# Patient Record
Sex: Female | Born: 1935 | Race: White | Hispanic: No | Smoking: Former smoker
Health system: Southern US, Community
[De-identification: ages and names within clinical notes are randomized; demographics above are authoritative.]

## PROBLEM LIST (undated history)

## (undated) DIAGNOSIS — D696 Thrombocytopenia, unspecified: Secondary | ICD-10-CM

## (undated) DIAGNOSIS — H269 Unspecified cataract: Secondary | ICD-10-CM

## (undated) DIAGNOSIS — G4700 Insomnia, unspecified: Secondary | ICD-10-CM

## (undated) DIAGNOSIS — J309 Allergic rhinitis, unspecified: Secondary | ICD-10-CM

## (undated) DIAGNOSIS — R911 Solitary pulmonary nodule: Secondary | ICD-10-CM

## (undated) DIAGNOSIS — E119 Type 2 diabetes mellitus without complications: Secondary | ICD-10-CM

## (undated) DIAGNOSIS — A4902 Methicillin resistant Staphylococcus aureus infection, unspecified site: Secondary | ICD-10-CM

## (undated) HISTORY — DX: Unspecified cataract: H26.9

## (undated) HISTORY — DX: Type 2 diabetes mellitus without complications: E11.9

## (undated) HISTORY — PX: ELBOW SURGERY: SHX618

## (undated) HISTORY — DX: Thrombocytopenia, unspecified: D69.6

## (undated) HISTORY — DX: Allergic rhinitis, unspecified: J30.9

## (undated) HISTORY — DX: Insomnia, unspecified: G47.00

## (undated) HISTORY — PX: CATARACT EXTRACTION: SUR2

## (undated) HISTORY — PX: TUBAL LIGATION: SHX77

## (undated) HISTORY — PX: WRIST SURGERY: SHX841

---
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
## 1998-09-14 DIAGNOSIS — A4902 Methicillin resistant Staphylococcus aureus infection, unspecified site: Secondary | ICD-10-CM

## 1998-09-14 HISTORY — DX: Methicillin resistant Staphylococcus aureus infection, unspecified site: A49.02

## 2018-06-20 DIAGNOSIS — E119 Type 2 diabetes mellitus without complications: Secondary | ICD-10-CM | POA: Insufficient documentation

## 2018-06-20 DIAGNOSIS — G4700 Insomnia, unspecified: Secondary | ICD-10-CM | POA: Insufficient documentation

## 2018-06-24 ENCOUNTER — Other Ambulatory Visit: Payer: Self-pay | Admitting: Internal Medicine

## 2018-06-24 DIAGNOSIS — Z1231 Encounter for screening mammogram for malignant neoplasm of breast: Secondary | ICD-10-CM

## 2018-07-14 ENCOUNTER — Ambulatory Visit
Admission: RE | Admit: 2018-07-14 | Discharge: 2018-07-14 | Disposition: A | Discharge status: Home / Self Care | Payer: Medicare Other | Source: Ambulatory Visit | Attending: Internal Medicine | Admitting: Internal Medicine

## 2018-07-14 ENCOUNTER — Encounter (HOSPITAL_COMMUNITY): Payer: Self-pay

## 2018-07-14 DIAGNOSIS — Z1231 Encounter for screening mammogram for malignant neoplasm of breast: Secondary | ICD-10-CM | POA: Diagnosis not present

## 2018-12-20 DIAGNOSIS — D693 Immune thrombocytopenic purpura: Secondary | ICD-10-CM

## 2018-12-20 HISTORY — DX: Immune thrombocytopenic purpura: D69.3

## 2019-02-20 ENCOUNTER — Encounter: Payer: Self-pay | Admitting: *Deleted

## 2019-02-20 ENCOUNTER — Encounter: Payer: Medicare Other | Attending: Internal Medicine | Admitting: *Deleted

## 2019-02-20 ENCOUNTER — Other Ambulatory Visit: Payer: Self-pay

## 2019-02-20 VITALS — BP 114/80 | Ht 68.0 in | Wt 188.6 lb

## 2019-02-20 DIAGNOSIS — E119 Type 2 diabetes mellitus without complications: Secondary | ICD-10-CM | POA: Diagnosis present

## 2019-02-20 NOTE — Progress Notes (Signed)
Diabetes Self-Management Education  Visit Type: First/Initial  Appt. Start Time: 1325 Appt. End Time: 1510  02/20/2019  [name], identified by name and date of birth, is a 84 y.o. female with a diagnosis of Diabetes: Type 2.   ASSESSMENT  Blood pressure 114/80, height 5\' 8"  (1.727 m), weight 188 lb 9.6 oz (85.5 kg). Body mass index is 28.68 kg/m.  Diabetes Self-Management Education - 02/20/19 1559      Visit Information   Visit Type  First/Initial      Initial Visit   Diabetes Type  Type 2    Are you currently following a meal plan?  Yes    What type of meal plan do you follow?  "counting carbs"    Are you taking your medications as prescribed?  Yes    Date Diagnosed  2009      Health Coping   How would you rate your overall health?  Good      Psychosocial Assessment   Patient Belief/Attitude about Diabetes  Other (comment)   "yuck"   Self-care barriers  None    Self-management support  Doctor's office;Friends;Family    Patient Concerns  Nutrition/Meal planning;Monitoring;Glycemic Control;Weight Control    Special Needs  None    Preferred Learning Style  Auditory    Learning Readiness  Change in progress    How often do you need to have someone help you when you read instructions, pamphlets, or other written materials from your doctor or pharmacy?  1 - Never    What is the last grade level you completed in school?  high school      Pre-Education Assessment   Patient understands the diabetes disease and treatment process.  Needs Review    Patient understands incorporating nutritional management into lifestyle.  Needs Review    Patient undertands incorporating physical activity into lifestyle.  Needs Instruction    Patient understands using medications safely.  Needs Review    Patient understands monitoring blood glucose, interpreting and using results  Needs Review    Patient understands prevention, detection, and treatment of acute complications.  Needs  Review    Patient understands prevention, detection, and treatment of chronic complications.  Needs Review    Patient understands how to develop strategies to address psychosocial issues.  Needs Review    Patient understands how to develop strategies to promote health/change behavior.  Needs Review      Complications   Last HgB A1C per patient/outside source  7.4 %   12/20/2018   How often do you check your blood sugar?  1-2 times/day    Fasting Blood glucose range (mg/dL)  130-179;180-200   Pt reports FBG's 170-190's mg/dL    Have you had a dilated eye exam in the past 12 months?  Yes    Have you had a dental exam in the past 12 months?  Yes    Are you checking your feet?  Yes    How many days per week are you checking your feet?  7      Dietary Intake   Breakfast  english muffin plain and sometimes with egg, sausage or bacon    Snack (morning)  0-1 snack/day - nabs, fruit (apple, blueberries, strawberries)    Lunch  soup, sandwich with few chips, [employer] salad or single hamburger    Dinner  chicken, pork - does stir fry with many vegetables (peppers, onions, broccoli, zucchini, squash), brown rice, occasional pasta, wilted lettuce    Beverage(s)  wtaer, unsweetened tea, coffe, diet soda      Exercise   Exercise Type  ADL's      Patient Education   Previous Diabetes Education  Yes (please comment)    Disease state   Explored patient's options for treatment of their diabetes    Nutrition management   Role of diet in the treatment of diabetes and the relationship between the three main macronutrients and blood glucose level;Carbohydrate counting;Food label reading, portion sizes and measuring food.;Reviewed blood glucose goals for pre and post meals and how to evaluate the patients' food intake on their blood glucose level.    Physical activity and exercise   Role of exercise on diabetes management, blood pressure control and cardiac health.    Medications  Reviewed patients medication  for diabetes, action, purpose, timing of dose and side effects.    Monitoring  Purpose and frequency of SMBG.;Taught/discussed recording of test results and interpretation of SMBG.;Identified appropriate SMBG and/or A1C goals.    Chronic complications  Relationship between chronic complications and blood glucose control    Psychosocial adjustment  Identified and addressed patients feelings and concerns about diabetes      Individualized Goals (developed by patient)   Reducing Risk  Improve blood sugars Prevent diabetes complications Lose weight     Outcomes   Expected Outcomes  Demonstrated interest in learning. Expect positive outcomes    Future DMSE  2 wks       Individualized Plan for Diabetes Self-Management Training:   Learning Objective:  Patient will have a greater understanding of diabetes self-management. Patient education plan is to attend individual and/or group sessions per assessed needs and concerns.   Plan:   Patient Instructions  Check blood sugars 1 x day before breakfast every day (occasionally check 2 hrs after supper) Bring blood sugar records to the next appointment Exercise: Walk as tolerated Eat 3 meals day, 1-2 snacks a day Space meals 4-6 hours apart Complete 3 Day Food Record and bring to next appt Return for appointment on:  Monday March 06, 2019 at 1:30 pm with Pam (dietitian)  Expected Outcomes:  Demonstrated interest in learning. Expect positive outcomes  Education material provided:  General Meal Planning Guidelines Simple Meal Plan 3 Day Food Record  If problems or questions, patient to contact team via:   Johny Drilling, RN, CCM, CDE [phone]  Future DSME appointment: 2 wks  March 06, 2019 with the dieititan

## 2019-02-20 NOTE — Patient Instructions (Addendum)
Check blood sugars 1 x day before breakfast every day (occasionally check 2 hrs after supper) Bring blood sugar records to the next appointment  Exercise: Walk as tolerated  Eat 3 meals day, 1-2 snacks a day Space meals 4-6 hours apart  Complete 3 Day Food Record and bring to next appt  Return for appointment on:  Monday March 06, 2019 at 1:30 pm with [facility] (dietitian)

## 2019-03-06 ENCOUNTER — Encounter: Payer: Medicare Other | Admitting: Dietician

## 2019-03-06 ENCOUNTER — Other Ambulatory Visit: Payer: Self-pay

## 2019-03-06 VITALS — BP 118/74 | Ht 68.0 in | Wt 189.3 lb

## 2019-03-06 DIAGNOSIS — E119 Type 2 diabetes mellitus without complications: Secondary | ICD-10-CM

## 2019-03-06 NOTE — Progress Notes (Signed)
Diabetes Self-Management Education  Visit Type:  Follow-up  Appt. Start Time: 1330 Appt. End Time: 1430  03/06/2019  Raven Lewis, identified by name and date of birth, is a 84 y.o. female with a diagnosis of Diabetes:  .   ASSESSMENT  Blood pressure 118/74, height 5\' 8"  (1.727 m), weight 189 lb 4.8 oz (85.9 kg). Body mass index is 28.78 kg/m.   Diabetes Self-Management Education - 45/40/98 1191      Complications   How often do you check your blood sugar?  1-2 times/day    Fasting Blood glucose range (mg/dL)  130-179    Have you had a dilated eye exam in the past 12 months?  Yes    Have you had a dental exam in the past 12 months?  Yes    Are you checking your feet?  Yes    How many days per week are you checking your feet?  7      Dietary Intake   Breakfast  3 meals and 1 snack daily      Exercise   Exercise Type  Light (walking / raking leaves)   stays active during the day   How many days per week to you exercise?  7    How many minutes per day do you exercise?  10    Total minutes per week of exercise  70      Patient Education   Nutrition management   Role of diet in the treatment of diabetes and the relationship between the three main macronutrients and blood glucose level;Information on hints to eating out and maintain blood glucose control.;Meal options for control of blood glucose level and chronic complications.;Other (comment)   consistent carb intake and healthy carb choices   Monitoring  Taught/discussed recording of test results and interpretation of SMBG.    Psychosocial adjustment  Role of stress on diabetes      Post-Education Assessment   Patient understands the diabetes disease and treatment process.  Demonstrates understanding / competency    Patient understands incorporating nutritional management into lifestyle.  Demonstrates understanding / competency    Patient undertands incorporating physical activity into lifestyle.  Demonstrates  understanding / competency    Patient understands using medications safely.  Demonstrates understanding / competency    Patient understands monitoring blood glucose, interpreting and using results  Demonstrates understanding / competency    Patient understands prevention, detection, and treatment of acute complications.  Needs Review    Patient understands prevention, detection, and treatment of chronic complications.  Needs Review    Patient understands how to develop strategies to address psychosocial issues.  Demonstrates understanding / competency    Patient understands how to develop strategies to promote health/change behavior.  Demonstrates understanding / competency      Outcomes   Program Status  Completed       Learning Objective:  Patient will have a greater understanding of diabetes self-management.  Patient has been making diet changes to control carbohydrate intake, and is counting her daily carb grams. Advised consistent carb intake throughout the day.    Plan:   Aim for consistent carb intake, 30-45grams with each meal, and goal of 160-170grams daily.    Expected Outcomes:  Demonstrated interest in learning. Expect positive outcomes  Education material provided:   Quick and Easy Meal Ideas  Smart Snacking  If problems or questions, patient to contact team via:  Phone

## 2019-06-05 ENCOUNTER — Other Ambulatory Visit: Payer: Self-pay | Admitting: Internal Medicine

## 2019-06-05 DIAGNOSIS — Z1231 Encounter for screening mammogram for malignant neoplasm of breast: Secondary | ICD-10-CM

## 2019-07-18 ENCOUNTER — Ambulatory Visit
Admission: RE | Admit: 2019-07-18 | Discharge: 2019-07-18 | Disposition: A | Discharge status: Home / Self Care | Payer: Medicare Other | Source: Ambulatory Visit | Attending: Internal Medicine | Admitting: Internal Medicine

## 2019-07-18 DIAGNOSIS — Z1231 Encounter for screening mammogram for malignant neoplasm of breast: Secondary | ICD-10-CM

## 2020-02-06 LAB — LIPID PANEL
Cholesterol: 182 (ref 0–200)
HDL: 59 (ref 35–70)
LDL Cholesterol: 88
Triglycerides: 172 — AB (ref 40–160)

## 2020-02-06 LAB — HEMOGLOBIN A1C: Hemoglobin A1C: 6.8

## 2020-06-10 ENCOUNTER — Other Ambulatory Visit: Payer: Self-pay | Admitting: Internal Medicine

## 2020-06-17 ENCOUNTER — Telehealth: Payer: Self-pay

## 2020-06-17 NOTE — Telephone Encounter (Signed)
Pt's daughter Lesle Chris called to report that the pt is sick, she has had daily diarrhea consistently for 5 weeks. She is diabetic and takes Metformin. She adjusted her Rx with her endocrinologist and no change.  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 VM available)

## 2020-06-17 NOTE — Telephone Encounter (Signed)
Copied from [place] [id number]. Topic: Appointment Scheduling - New Patient >> Jun 14, 2020  4:45 PM Rainey Pines A wrote: Patient wants to know if Dr. B would be able to take her on as a new patient. Please advise  Best contact [phone]

## 2020-07-16 ENCOUNTER — Other Ambulatory Visit: Payer: Self-pay

## 2020-07-16 ENCOUNTER — Ambulatory Visit (INDEPENDENT_AMBULATORY_CARE_PROVIDER_SITE_OTHER): Payer: Medicare Other | Admitting: Family Medicine

## 2020-07-16 ENCOUNTER — Encounter: Payer: Self-pay | Admitting: Family Medicine

## 2020-07-16 VITALS — BP 120/69 | HR 85 | Temp 97.9°F | Resp 16 | Ht 67.0 in | Wt 173.0 lb

## 2020-07-16 DIAGNOSIS — J309 Allergic rhinitis, unspecified: Secondary | ICD-10-CM | POA: Insufficient documentation

## 2020-07-16 DIAGNOSIS — R5383 Other fatigue: Secondary | ICD-10-CM

## 2020-07-16 DIAGNOSIS — D693 Immune thrombocytopenic purpura: Secondary | ICD-10-CM | POA: Diagnosis not present

## 2020-07-16 DIAGNOSIS — J301 Allergic rhinitis due to pollen: Secondary | ICD-10-CM

## 2020-07-16 DIAGNOSIS — G4700 Insomnia, unspecified: Secondary | ICD-10-CM | POA: Diagnosis not present

## 2020-07-16 DIAGNOSIS — E1129 Type 2 diabetes mellitus with other diabetic kidney complication: Secondary | ICD-10-CM | POA: Insufficient documentation

## 2020-07-16 DIAGNOSIS — D696 Thrombocytopenia, unspecified: Secondary | ICD-10-CM | POA: Insufficient documentation

## 2020-07-16 DIAGNOSIS — K591 Functional diarrhea: Secondary | ICD-10-CM | POA: Insufficient documentation

## 2020-07-16 DIAGNOSIS — R809 Proteinuria, unspecified: Secondary | ICD-10-CM

## 2020-07-16 DIAGNOSIS — C439 Malignant melanoma of skin, unspecified: Secondary | ICD-10-CM | POA: Insufficient documentation

## 2020-07-16 DIAGNOSIS — E663 Overweight: Secondary | ICD-10-CM | POA: Insufficient documentation

## 2020-07-16 DIAGNOSIS — L989 Disorder of the skin and subcutaneous tissue, unspecified: Secondary | ICD-10-CM

## 2020-07-16 MED ORDER — SEMAGLUTIDE(0.25 OR 0.5MG/DOS) 2 MG/1.5ML ~~LOC~~ SOPN: 0.5000 mg | PEN_INJECTOR | SUBCUTANEOUS | 3 refills | Status: DC

## 2020-07-16 MED ORDER — LISINOPRIL 5 MG PO TABS
5.0000 mg | ORAL_TABLET | Freq: Every day | ORAL | 3 refills | Status: DC
Start: 2020-07-16 — End: 2020-08-20

## 2020-07-16 MED ORDER — ZOLPIDEM TARTRATE 5 MG PO TABS
5.0000 mg | ORAL_TABLET | Freq: Every evening | ORAL | 5 refills | Status: DC | PRN
Start: 2020-07-16 — End: 2020-08-20

## 2020-07-16 MED ORDER — METFORMIN HCL ER 500 MG PO TB24: 500.0000 mg | ORAL_TABLET | Freq: Two times a day (BID) | ORAL | 3 refills | Status: DC

## 2020-07-16 NOTE — Assessment & Plan Note (Signed)
Patient is not sure how long this has been present It is bluish in color and seems to have bled recently Concern for possible basal cell carcinoma Referral to dermatology for evaluation and possible biopsy

## 2020-07-16 NOTE — Assessment & Plan Note (Signed)
Longstanding issue She has taken Ambien for many years We did discuss the possible risks/harms associated with Ambien in the elderly Patient wishes to continue We will not plan to increase dose Refill given for 98-month supply of Ambien 5 mg nightly as needed today

## 2020-07-16 NOTE — Assessment & Plan Note (Signed)
Unclear etiology Longstanding and mild Recheck CBC Avoid aspirin

## 2020-07-16 NOTE — Assessment & Plan Note (Signed)
Continue low-dose lisinopril

## 2020-07-16 NOTE — Progress Notes (Signed)
New patient visit   Patient: Raven Lewis   DOB: 1935-09-04   85 y.o. Female  MRN: [id number] Visit Date: 07/16/2020  Today's healthcare provider: Lavon Paganini, MD   Chief Complaint  Patient presents with  . New Patient (Initial Visit)  . Diarrhea   Subjective    Raven Lewis is a 85 y.o. female who presents today as a new patient to establish care.  Patient reports that she has had diarrhea for the whole month of September. Did better in October.  Had her energy back.  A cousin visited yesterday.  She went out to eat last night and had Chicken Parm.  Had 2 normal BMs.  Does not eat many vegetables and has intermittent constipation.  She reports that this morning it was the worst. She had explosive diarrhea that covered the bathroom walls.  She denies fevers, nausea, vomiting, or feels any relation to foods.  No blood in the stool  Windowed 4 years ago. Moved ot this area 2 years ago.  Has had major diet changes since moving here and living alone.  Cutting back on dairy and cutting out alcohol was not helpful.  Has been taking Ambien 5mg  qhs for many years for insomnia.  Feels this controls it well.    T2DM: Metformin XR 500mg  BID, Ozempic 0.25mg  weekly.  Followed by Dr Gabriel Carina.  Does not think Metformin contributes to diarrhea.  Wants  Past Medical History:  Diagnosis Date  . Allergic rhinitis   . Cataract   . Diabetes mellitus without complication ([place])   . Insomnia   . MRSA (methicillin resistant Staphylococcus aureus) 2000   right elbow  . Thrombocytopenia ([place])    Past Surgical History:  Procedure Laterality Date  . CATARACT EXTRACTION Left   . ELBOW SURGERY     MRSA in right elbow  . TUBAL LIGATION    . WRIST SURGERY     left wrist   Family Status  Relation Name Status  . Mother  Deceased  . Father  Deceased  . Sister  Deceased  . Brother  Deceased  . Neg Hx  (Not Specified)   Family History  Problem Relation Age of Onset  . Breast cancer  Mother 19  . Heart disease Sister        heart failure  . Lung cancer Brother   . Colon cancer Neg Hx    Social History   Socioeconomic History  . Marital status: Widowed    Spouse name: Not on file  . Number of children: 2  . Years of education: Not on file  . Highest education level: Not on file  Occupational History  . Occupation: retired  Tobacco Use  . Smoking status: Former Smoker    Packs/day: 1.50    Years: 40.00    Pack years: 60.00    Types: Cigarettes    Quit date: 11/29/2000    Years since quitting: 19.6  . Smokeless tobacco: Never Used  Vaping Use  . Vaping Use: Never used  Substance and Sexual Activity  . Alcohol use: Yes    Alcohol/week: 1.0 standard drink    Types: 1 Glasses of wine per week    Comment: occasionally, less than weekly  . Drug use: Never  . Sexual activity: Not Currently  Other Topics Concern  . Not on file  Social History Narrative  . Not on file   Social Determinants of Health   Financial Resource Strain:   . Difficulty of Paying  Living Expenses: Not on file  Food Insecurity:   . Worried About Charity fundraiser in the Last Year: Not on file  . Ran Out of Food in the Last Year: Not on file  Transportation Needs:   . Lack of Transportation (Medical): Not on file  . Lack of Transportation (Non-Medical): Not on file  Physical Activity:   . Days of Exercise per Week: Not on file  . Minutes of Exercise per Session: Not on file  Stress:   . Feeling of Stress : Not on file  Social Connections:   . Frequency of Communication with Friends and Family: Not on file  . Frequency of Social Gatherings with Friends and Family: Not on file  . Attends Religious Services: Not on file  . Active Member of Clubs or Organizations: Not on file  . Attends Archivist Meetings: Not on file  . Marital Status: Not on file   Outpatient Medications Prior to Visit  Medication Sig  . [DISCONTINUED] lisinopril (ZESTRIL) 5 MG tablet Take 5 mg by  mouth daily.  . clobetasol (TEMOVATE) 0.05 % external solution   . FREESTYLE TEST STRIPS test strip TEST TID UTD  . Lancets (FREESTYLE) lancets USE TID  . Loratadine 10 MG CAPS Take 10 mg by mouth daily as needed.  Marland Kitchen olopatadine (PATANOL) 0.1 % ophthalmic solution INSTILL 1 DROP INTO BOTH EYES TWICE A DAY  . [DISCONTINUED] meclizine (ANTIVERT) 25 MG tablet   . [DISCONTINUED] metFORMIN (GLUCOPHAGE-XR) 500 MG 24 hr tablet Take 1 tablet by mouth daily.  . [DISCONTINUED] ondansetron (ZOFRAN) 4 MG tablet   . [DISCONTINUED] Semaglutide,0.25 or 0.5MG /DOS, 2 MG/1.5ML SOPN Inject 0.25 mg into the skin once a week.  . [DISCONTINUED] zolpidem (AMBIEN) 5 MG tablet Take 5 mg by mouth at bedtime as needed.   No facility-administered medications prior to visit.   No Known Allergies  Immunization History  Administered Date(s) Administered  . PFIZER SARS-COV-2 Vaccination 09/25/2019, 10/16/2019    Health Maintenance  Topic Date Due  . HEMOGLOBIN A1C  Never done  . OPHTHALMOLOGY EXAM  Never done  . TETANUS/TDAP  Never done  . DEXA SCAN  Never done  . PNA vac Low Risk Adult (1 of 2 - PCV13) Never done  . FOOT EXAM  07/16/2021  . INFLUENZA VACCINE  Completed  . COVID-19 Vaccine  Completed    Patient Care Team: Virginia Crews, MD as PCP - General (Family Medicine)  Review of Systems  Constitutional: Negative.   HENT: Negative.   Eyes: Negative.   Respiratory: Negative.   Cardiovascular: Negative.   Gastrointestinal: Positive for diarrhea.  Endocrine: Negative.   Genitourinary: Negative.   Musculoskeletal: Negative.   Skin: Negative.   Allergic/Immunologic: Negative.   Neurological: Negative.   Hematological: Negative.   Psychiatric/Behavioral: Negative.       Objective    BP 120/69   Pulse 85   Temp 97.9 F (36.6 C)   Resp 16   Ht 5\' 7"  (1.702 m)   Wt 173 lb (78.5 kg)   BMI 27.10 kg/m  Physical Exam Vitals reviewed.  Constitutional:      General: She is not in  acute distress.    Appearance: Normal appearance. She is well-developed. She is not diaphoretic.  HENT:     Head: Normocephalic and atraumatic.  Eyes:     General: No scleral icterus.    Conjunctiva/sclera: Conjunctivae normal.  Neck:     Thyroid: No thyromegaly.  Cardiovascular:  Rate and Rhythm: Normal rate and regular rhythm.     Pulses: Normal pulses.     Heart sounds: Normal heart sounds. No murmur heard.   Pulmonary:     Effort: Pulmonary effort is normal. No respiratory distress.     Breath sounds: Normal breath sounds. No wheezing, rhonchi or rales.  Musculoskeletal:     Cervical back: Neck supple.     Right lower leg: No edema.     Left lower leg: No edema.  Lymphadenopathy:     Cervical: No cervical adenopathy.  Skin:    General: Skin is warm and dry.     Comments: Lesion on frontal scalp underneath hair that is about 1 cm in diameter and bluish in color  Neurological:     Mental Status: She is alert and oriented to person, place, and time. Mental status is at baseline.  Psychiatric:        Mood and Affect: Mood normal.        Behavior: Behavior normal.     Depression Screen PHQ 2/9 Scores 07/16/2020 02/20/2019  PHQ - 2 Score 0 0   No results found for any visits on 07/16/20.  Assessment & Plan      Problem List Items Addressed This Visit      Respiratory   Allergic rhinitis    Chronic and fairly well controlled Continue OTC Claritin        Digestive   Functional diarrhea    Was ongoing for about a month Did have some improvement throughout the month of October after switching her doses of Metformin to be before bedtime Has now recurred No signs of infectious diarrhea given time course and lack of infectious symptoms Check TSH, CMP, CBC Advised on dietary changes, including adding fiber to her diet Patient will keep a log of food intake when this occurs so that we can find any pattern If not improving, we will need to try holding her metformin to  see if it improves and possibly switching to another antidiabetic medication      Relevant Orders   Basic Metabolic Panel (BMET)   TSH     Endocrine   T2DM (type 2 diabetes mellitus) ([place]) - Primary    Previously well controlled with last A1c 6.8 She has been followed by endocrinology, but this prefers PCP to follow diabetes as well as her other chronic conditions if possible She is not currently on a statin She is on an ACE inhibitor Foot exam completed today Referral to ophthalmology for next eye exam ROI sent for last eye exam ROI sent for vaccination records Continue current medications, but consider stopping Metformin if diarrhea does not improve as above Recheck A1c      Relevant Medications   metFORMIN (GLUCOPHAGE-XR) 500 MG 24 hr tablet   Semaglutide,0.25 or 0.5MG /DOS, 2 MG/1.5ML SOPN   lisinopril (ZESTRIL) 5 MG tablet   Other Relevant Orders   Ambulatory referral to Ophthalmology   Basic Metabolic Panel (BMET)   Hemoglobin A1c   Microalbuminuria due to type 2 diabetes mellitus (HCC)    Continue low-dose lisinopril      Relevant Medications   metFORMIN (GLUCOPHAGE-XR) 500 MG 24 hr tablet   Semaglutide,0.25 or 0.5MG /DOS, 2 MG/1.5ML SOPN   lisinopril (ZESTRIL) 5 MG tablet     Musculoskeletal and Integument   Scalp lesion    Patient is not sure how long this has been present It is bluish in color and seems to have bled recently Concern for  possible basal cell carcinoma Referral to dermatology for evaluation and possible biopsy      Relevant Orders   Ambulatory referral to Dermatology     Hematopoietic and Hemostatic   Thrombocytopenia, idiopathic (HCC)    Unclear etiology Longstanding and mild Recheck CBC Avoid aspirin      Relevant Orders   CBC w/Diff/Platelet     Other   Insomnia    Longstanding issue She has taken Ambien for many years We did discuss the possible risks/harms associated with Ambien in the elderly Patient wishes to continue We  will not plan to increase dose Refill given for 74-month supply of Ambien 5 mg nightly as needed today      Overweight    Discussed importance of healthy weight management Discussed diet and exercise       Relevant Orders   Basic Metabolic Panel (BMET)   Hemoglobin A1c   CBC w/Diff/Platelet   TSH    Other Visit Diagnoses    Other fatigue        Relevant Orders   TSH       Reviewed patient's records in [facility] everywhere ROI sent to [facility] for immunization record We will abstract last labs from April and May 2021 ROI sent to [facility] for last eye exam  Return in about 3 months (around 10/16/2020) for chronic disease f/u.      Total time spent on today's visit was greater than 60 minutes, including both face-to-face time and nonface-to-face time personally spent on review of chart (labs and imaging), discussing labs and goals, discussing further work-up, treatment options, referrals to specialist if needed, reviewing outside records of pertinent, answering patient's questions, and coordinating care.    I, Lavon Paganini, MD, have reviewed all documentation for this visit. The documentation on 07/16/20 for the exam, diagnosis, procedures, and orders are all accurate and complete.   Terik Haughey, Dionne Bucy, MD, MPH [facility]

## 2020-07-16 NOTE — Assessment & Plan Note (Signed)
Was ongoing for about a month Did have some improvement throughout the month of October after switching her doses of Metformin to be before bedtime Has now recurred No signs of infectious diarrhea given time course and lack of infectious symptoms Check TSH, CMP, CBC Advised on dietary changes, including adding fiber to her diet Patient will keep a log of food intake when this occurs so that we can find any pattern If not improving, we will need to try holding her metformin to see if it improves and possibly switching to another antidiabetic medication

## 2020-07-16 NOTE — Assessment & Plan Note (Addendum)
Previously well controlled with last A1c 6.8 She has been followed by endocrinology, but this prefers PCP to follow diabetes as well as her other chronic conditions if possible She is not currently on a statin She is on an ACE inhibitor Foot exam completed today Referral to ophthalmology for next eye exam ROI sent for last eye exam ROI sent for vaccination records Continue current medications, but consider stopping Metformin if diarrhea does not improve as above Recheck A1c

## 2020-07-16 NOTE — Assessment & Plan Note (Signed)
Discussed importance of healthy weight management Discussed diet and exercise  

## 2020-07-16 NOTE — Assessment & Plan Note (Signed)
Chronic and fairly well controlled Continue OTC Claritin

## 2020-07-17 ENCOUNTER — Other Ambulatory Visit: Payer: Self-pay | Admitting: Physician Assistant

## 2020-07-17 ENCOUNTER — Other Ambulatory Visit: Payer: Self-pay | Admitting: Family Medicine

## 2020-07-17 DIAGNOSIS — Z1231 Encounter for screening mammogram for malignant neoplasm of breast: Secondary | ICD-10-CM

## 2020-07-17 LAB — BASIC METABOLIC PANEL
BUN/Creatinine Ratio: 21 (ref 12–28)
BUN: 15 mg/dL (ref 8–27)
CO2: 22 mmol/L (ref 20–29)
Calcium: 9.8 mg/dL (ref 8.7–10.3)
Chloride: 102 mmol/L (ref 96–106)
Creatinine, Ser: 0.71 mg/dL (ref 0.57–1.00)
GFR calc Af Amer: 91 mL/min/{1.73_m2} (ref >59)
GFR calc non Af Amer: 79 mL/min/{1.73_m2} (ref >59)
Glucose: 135 mg/dL — ABNORMAL HIGH (ref 65–99)
Potassium: 4.3 mmol/L (ref 3.5–5.2)
Sodium: 139 mmol/L (ref 134–144)

## 2020-07-17 LAB — CBC WITH DIFFERENTIAL/PLATELET
Basophils Absolute: 0 10*3/uL (ref 0.0–0.2)
Basos: 1 %
EOS (ABSOLUTE): 0.1 10*3/uL (ref 0.0–0.4)
Eos: 2 %
Hematocrit: 40.6 % (ref 34.0–46.6)
Hemoglobin: 13.7 g/dL (ref 11.1–15.9)
Immature Grans (Abs): 0 10*3/uL (ref 0.0–0.1)
Immature Granulocytes: 0 %
Lymphocytes Absolute: 1.2 10*3/uL (ref 0.7–3.1)
Lymphs: 18 %
MCH: 33.5 pg — ABNORMAL HIGH (ref 26.6–33.0)
MCHC: 33.7 g/dL (ref 31.5–35.7)
MCV: 99 fL — ABNORMAL HIGH (ref 79–97)
Monocytes Absolute: 0.5 10*3/uL (ref 0.1–0.9)
Monocytes: 7 %
Neutrophils Absolute: 4.7 10*3/uL (ref 1.4–7.0)
Neutrophils: 72 %
Platelets: 153 10*3/uL (ref 150–450)
RBC: 4.09 x10E6/uL (ref 3.77–5.28)
RDW: 12.8 % (ref 11.7–15.4)
WBC: 6.6 10*3/uL (ref 3.4–10.8)

## 2020-07-17 LAB — HEMOGLOBIN A1C
Est. average glucose Bld gHb Est-mCnc: 143 mg/dL
Hgb A1c MFr Bld: 6.6 % — ABNORMAL HIGH (ref 4.8–5.6)

## 2020-07-17 LAB — TSH: TSH: 1.24 u[IU]/mL (ref 0.450–4.500)

## 2020-07-19 ENCOUNTER — Ambulatory Visit
Admission: RE | Admit: 2020-07-19 | Discharge: 2020-07-19 | Disposition: A | Discharge status: Home / Self Care | Payer: Medicare Other | Source: Ambulatory Visit | Attending: Family Medicine | Admitting: Family Medicine

## 2020-07-19 ENCOUNTER — Other Ambulatory Visit: Payer: Self-pay

## 2020-07-19 DIAGNOSIS — Z1231 Encounter for screening mammogram for malignant neoplasm of breast: Secondary | ICD-10-CM | POA: Diagnosis present

## 2020-07-25 ENCOUNTER — Encounter: Payer: Self-pay | Admitting: Family Medicine

## 2020-08-01 ENCOUNTER — Encounter: Payer: Self-pay | Admitting: Family Medicine

## 2020-08-01 ENCOUNTER — Ambulatory Visit (INDEPENDENT_AMBULATORY_CARE_PROVIDER_SITE_OTHER): Payer: Medicare Other | Admitting: Family Medicine

## 2020-08-01 ENCOUNTER — Other Ambulatory Visit: Payer: Self-pay

## 2020-08-01 VITALS — Temp 97.8°F

## 2020-08-01 DIAGNOSIS — J029 Acute pharyngitis, unspecified: Secondary | ICD-10-CM | POA: Diagnosis not present

## 2020-08-01 MED ORDER — PREDNISONE 5 MG PO TABS: ORAL_TABLET | ORAL | 0 refills | Status: DC

## 2020-08-01 MED ORDER — AMOXICILLIN 500 MG PO CAPS: 500.0000 mg | ORAL_CAPSULE | Freq: Three times a day (TID) | ORAL | 0 refills | Status: DC

## 2020-08-01 NOTE — Progress Notes (Signed)
Virtual telephone visit    Virtual Visit via Telephone Note   This visit type was conducted due to national recommendations for restrictions regarding the COVID-19 Pandemic (e.g. social distancing) in an effort to limit this patient's exposure and mitigate transmission in our community. Due to her co-morbid illnesses, this patient is at least at moderate risk for complications without adequate follow up. This format is felt to be most appropriate for this patient at this time. The patient did not have access to video technology or had technical difficulties with video requiring transitioning to audio format only (telephone). Physical exam was limited to content and character of the telephone converstion.    Patient location: Home Provider location: Office  I discussed the limitations of evaluation and management by telemedicine and the availability of in person appointments. The patient expressed understanding and agreed to proceed.   Visit Date: 08/01/2020  Today's healthcare provider: Vernie Murders, PA   Chief Complaint  Patient presents with  . Sore Throat   Subjective    Sore Throat  This is a new problem. The current episode started yesterday. The problem has been gradually worsening. There has been no fever. Associated symptoms include trouble swallowing. Pertinent negatives include no congestion, coughing, ear pain or headaches. She has had no exposure to strep. She has tried gargles and acetaminophen for the symptoms. The treatment provided mild relief.        Past Medical History:  Diagnosis Date  . Allergic rhinitis   . Cataract   . Diabetes mellitus without complication ([place])   . Insomnia   . MRSA (methicillin resistant Staphylococcus aureus) 2000   right elbow  . Thrombocytopenia ([place])    Past Surgical History:  Procedure Laterality Date  . CATARACT EXTRACTION Left   . ELBOW SURGERY     MRSA in right elbow  . TUBAL LIGATION    . WRIST SURGERY     left  wrist   Social History   Tobacco Use  . Smoking status: Former Smoker    Packs/day: 1.50    Years: 40.00    Pack years: 60.00    Types: Cigarettes    Quit date: 11/29/2000    Years since quitting: 19.6  . Smokeless tobacco: Never Used  Vaping Use  . Vaping Use: Never used  Substance Use Topics  . Alcohol use: Yes    Alcohol/week: 1.0 standard drink    Types: 1 Glasses of wine per week    Comment: occasionally, less than weekly  . Drug use: Never   Family History  Problem Relation Age of Onset  . Breast cancer Mother 77  . Heart disease Sister        heart failure  . Lung cancer Brother   . Colon cancer Neg Hx    No Known Allergies    Medications: Outpatient Medications Prior to Visit  Medication Sig  . clobetasol (TEMOVATE) 0.05 % external solution   . FREESTYLE TEST STRIPS test strip TEST TID UTD  . Lancets (FREESTYLE) lancets USE TID  . lisinopril (ZESTRIL) 5 MG tablet Take 1 tablet (5 mg total) by mouth daily.  . Loratadine 10 MG CAPS Take 10 mg by mouth daily as needed.  . metFORMIN (GLUCOPHAGE-XR) 500 MG 24 hr tablet Take 1 tablet (500 mg total) by mouth in the morning and at bedtime.  Marland Kitchen olopatadine (PATANOL) 0.1 % ophthalmic solution INSTILL 1 DROP INTO BOTH EYES TWICE A DAY  . Semaglutide,0.25 or 0.5MG /DOS, 2 MG/1.5ML SOPN  Inject 0.5 mg into the skin once a week.  . zolpidem (AMBIEN) 5 MG tablet Take 1 tablet (5 mg total) by mouth at bedtime as needed. Do not fill <30 days from last refill   No facility-administered medications prior to visit.    Review of Systems  Constitutional: Positive for fatigue. Negative for fever.  HENT: Positive for sore throat, trouble swallowing and voice change. Negative for congestion, ear pain, sinus pressure and sinus pain.   Respiratory: Negative for cough.   Cardiovascular: Negative.   Neurological: Negative for headaches.      Objective    Temp 97.8 F (36.6 C) Comment: per patient  During telephonic interview,  patient talks as if she is holding a "hot potato" or "marbles" in her mouth. No apparent dyspnea or wheeze.   Assessment & Plan     1. Acute pharyngitis, unspecified etiology Had some PND with allergies the past few days but it stopped and a severe sore throat with swollen uvula (by description) developed yesterday. Slight help from saltwater gargles this morning. States temperature has not been elevated, no dyspnea, loss of taste or chills. No exposure to COVID known. Will treat with suspected strep throat with uvulitis. Given prednisone taper for swelling. Advised to go to the ER if having dyspnea or dysphagia/choking. Call or return prn. - amoxicillin (AMOXIL) 500 MG capsule; Take 1 capsule (500 mg total) by mouth 3 (three) times daily.  Dispense: 30 capsule; Refill: 0 - predniSONE (DELTASONE) 5 MG tablet; Taper down by 1 tablet daily by mouth starting at 6 day 1, then, 5 day 2, 4 day 3, 3 day 4, 2 day 5 and 1 day 6. Divide dosage among meals and bedtime each day.  Dispense: 21 tablet; Refill: 0   No follow-ups on file.    I discussed the assessment and treatment plan with the patient. The patient was provided an opportunity to ask questions and all were answered. The patient agreed with the plan and demonstrated an understanding of the instructions.   The patient was advised to call back or seek an in-person evaluation if the symptoms worsen or if the condition fails to improve as anticipated.  I provided 20 minutes of non-face-to-face time during this encounter.  Andres Shad, PA, have reviewed all documentation for this visit. The documentation on 08/01/20 for the exam, diagnosis, procedures, and orders are all accurate and complete.   Vernie Murders, [place] [phone] (phone) [phone] (fax)  [place]

## 2020-08-19 ENCOUNTER — Telehealth: Payer: Self-pay

## 2020-08-19 NOTE — Telephone Encounter (Signed)
Copied from [place] [id number]. Topic: Quick Communication - See Telephone Encounter >> Aug 19, 2020  8:15 AM Loma Boston wrote: CRM for notification. See Telephone encounter for: 08/19/20.Please call pt for a possible appt, having diarrhea with metformin but also told by Dr B she would handle standard GI issues due to history [phone] please verify with nurse and call pt back for appt at [phone]

## 2020-08-19 NOTE — Telephone Encounter (Signed)
Ok to schedule appt

## 2020-08-20 ENCOUNTER — Other Ambulatory Visit: Payer: Self-pay

## 2020-08-20 ENCOUNTER — Ambulatory Visit (INDEPENDENT_AMBULATORY_CARE_PROVIDER_SITE_OTHER): Payer: Medicare Other | Admitting: Family Medicine

## 2020-08-20 ENCOUNTER — Encounter: Payer: Self-pay | Admitting: Family Medicine

## 2020-08-20 VITALS — BP 122/72 | HR 70 | Temp 97.9°F | Resp 16 | Wt 175.0 lb

## 2020-08-20 DIAGNOSIS — G4700 Insomnia, unspecified: Secondary | ICD-10-CM | POA: Diagnosis not present

## 2020-08-20 DIAGNOSIS — E1129 Type 2 diabetes mellitus with other diabetic kidney complication: Secondary | ICD-10-CM

## 2020-08-20 DIAGNOSIS — K591 Functional diarrhea: Secondary | ICD-10-CM

## 2020-08-20 DIAGNOSIS — R002 Palpitations: Secondary | ICD-10-CM | POA: Insufficient documentation

## 2020-08-20 DIAGNOSIS — R809 Proteinuria, unspecified: Secondary | ICD-10-CM

## 2020-08-20 MED ORDER — LISINOPRIL 5 MG PO TABS
5.0000 mg | ORAL_TABLET | Freq: Every day | ORAL | 3 refills | Status: DC
Start: 2020-08-20 — End: 2020-10-29

## 2020-08-20 MED ORDER — ZOLPIDEM TARTRATE 5 MG PO TABS
5.0000 mg | ORAL_TABLET | Freq: Every evening | ORAL | 5 refills | Status: DC | PRN
Start: 2020-08-20 — End: 2020-08-29

## 2020-08-20 NOTE — Assessment & Plan Note (Signed)
Longstanding issue Has taken Ambien for many years We did discuss the possible risks/harms associated with Ambien in the elderly Patient wishes to continue We will not plan to increase dose Refill given for 24-month supply of Ambien 5 mg nightly as needed today

## 2020-08-20 NOTE — Assessment & Plan Note (Signed)
Previously well controlled Stop Metformin as above for diarrhea We will continue current dose of Ozempic but may need to consider increasing it to accommodate for stopping Metformin

## 2020-08-20 NOTE — Patient Instructions (Addendum)
Stop metformin  Cardiology will call for an appt    Palpitations Palpitations are feelings that your heartbeat is not normal. Your heartbeat may feel like it is:  Uneven.  Faster than normal.  Fluttering.  Skipping a beat. This is usually not a serious problem. In some cases, you may need tests to rule out any serious problems. Follow these instructions at home: Pay attention to any changes in your condition. Take these actions to help manage your symptoms: Eating and drinking  Avoid: ? Coffee, tea, soft drinks, and energy drinks. ? Chocolate. ? Alcohol. ? Diet pills. Lifestyle   Try to lower your stress. These things can help you relax: ? Yoga. ? Deep breathing and meditation. ? Exercise. ? Using words and images to create positive thoughts (guided imagery). ? Using your mind to control things in your body (biofeedback).  Do not use drugs.  Get plenty of rest and sleep. Keep a regular bed time. General instructions   Take over-the-counter and prescription medicines only as told by your doctor.  Do not use any products that contain nicotine or tobacco, such as cigarettes and e-cigarettes. If you need help quitting, ask your doctor.  Keep all follow-up visits as told by your doctor. This is important. You may need more tests if palpitations do not go away or get worse. Contact a doctor if:  Your symptoms last more than 24 hours.  Your symptoms occur more often. Get help right away if you:  Have chest pain.  Feel short of breath.  Have a very bad headache.  Feel dizzy.  Pass out (faint). Summary  Palpitations are feelings that your heartbeat is uneven or faster than normal. It may feel like your heart is fluttering or skipping a beat.  Avoid food and drinks that may cause palpitations. These include caffeine, chocolate, and alcohol.  Try to lower your stress. Do not smoke or use drugs.  Get help right away if you faint or have chest pain, shortness  of breath, a severe headache, or dizziness. This information is not intended to replace advice given to you by your health care provider. Make sure you discuss any questions you have with your health care provider. Document Revised: 10/13/2017 Document Reviewed: 10/13/2017 Elsevier Patient Education  2020 [employer].

## 2020-08-20 NOTE — Assessment & Plan Note (Signed)
New problem EKG today NSR Labs 1 month ago - normal Referral to cardiology for further eval including possible Zio patch

## 2020-08-20 NOTE — Assessment & Plan Note (Signed)
Likely 2/2 metformin No red flags Benign abd exam Recommend stopping metformin and see if it persists Could consider immodium prn in the future if persists despite stopping metformin

## 2020-08-20 NOTE — Progress Notes (Signed)
Established patient visit   Patient: Raven Lewis   DOB: May 21, 1935   85 y.o. Female  MRN: [id number] Visit Date: 08/20/2020  Today's healthcare provider: Lavon Paganini, MD   Chief Complaint  Patient presents with  . Diarrhea   Subjective    HPI  Follow up for diarrhea  The patient was last seen for this 5 weeks ago. Changes made at last visit include no changes. Continue Metformin  She reports excellent compliance with treatment. She feels that condition is Unchanged. She is having side effects. diarrhea  -----------------------------------------------------------------------------------------  Patient Active Problem List   Diagnosis Date Noted  . Palpitations 08/20/2020  . Overweight 07/16/2020  . Functional diarrhea 07/16/2020  . Scalp lesion 07/16/2020  . Microalbuminuria due to type 2 diabetes mellitus ([place]) 07/16/2020  . Allergic rhinitis   . T2DM (type 2 diabetes mellitus) ([place]) 03/06/2019  . Thrombocytopenia, idiopathic ([place]) 12/20/2018  . Insomnia 06/20/2018   Social History   Tobacco Use  . Smoking status: Former Smoker    Packs/day: 1.50    Years: 40.00    Pack years: 60.00    Types: Cigarettes    Quit date: 11/29/2000    Years since quitting: 19.7  . Smokeless tobacco: Never Used  Vaping Use  . Vaping Use: Never used  Substance Use Topics  . Alcohol use: Yes    Alcohol/week: 1.0 standard drink    Types: 1 Glasses of wine per week    Comment: occasionally, less than weekly  . Drug use: Never   No Known Allergies     Medications: Outpatient Medications Prior to Visit  Medication Sig  . clobetasol (TEMOVATE) 0.05 % external solution   . FREESTYLE TEST STRIPS test strip TEST TID UTD  . Lancets (FREESTYLE) lancets USE TID  . Loratadine 10 MG CAPS Take 10 mg by mouth daily as needed.  Marland Kitchen olopatadine (PATANOL) 0.1 % ophthalmic solution INSTILL 1 DROP INTO BOTH EYES TWICE A DAY  . Semaglutide,0.25 or 0.5MG /DOS, 2 MG/1.5ML SOPN Inject  0.5 mg into the skin once a week.  . [DISCONTINUED] lisinopril (ZESTRIL) 5 MG tablet Take 1 tablet (5 mg total) by mouth daily.  . [DISCONTINUED] metFORMIN (GLUCOPHAGE-XR) 500 MG 24 hr tablet Take 1 tablet (500 mg total) by mouth in the morning and at bedtime.  . [DISCONTINUED] zolpidem (AMBIEN) 5 MG tablet Take 1 tablet (5 mg total) by mouth at bedtime as needed. Do not fill <30 days from last refill  . [DISCONTINUED] amoxicillin (AMOXIL) 500 MG capsule Take 1 capsule (500 mg total) by mouth 3 (three) times daily.  . [DISCONTINUED] predniSONE (DELTASONE) 5 MG tablet Taper down by 1 tablet daily by mouth starting at 6 day 1, then, 5 day 2, 4 day 3, 3 day 4, 2 day 5 and 1 day 6. Divide dosage among meals and bedtime each day.   No facility-administered medications prior to visit.    Review of Systems  Last CBC Lab Results  Component Value Date   WBC 6.6 07/16/2020   HGB 13.7 07/16/2020   HCT 40.6 07/16/2020   MCV 99 (H) 07/16/2020   MCH 33.5 (H) 07/16/2020   RDW 12.8 07/16/2020   PLT 153 24/26/8341   Last metabolic panel Lab Results  Component Value Date   GLUCOSE 135 (H) 07/16/2020   NA 139 07/16/2020   K 4.3 07/16/2020   CL 102 07/16/2020   CO2 22 07/16/2020   BUN 15 07/16/2020   CREATININE 0.71 07/16/2020   GFRNONAA  79 07/16/2020   GFRAA 91 07/16/2020   CALCIUM 9.8 07/16/2020   Last lipids Lab Results  Component Value Date   CHOL 182 02/06/2020   HDL 59 02/06/2020   LDLCALC 88 02/06/2020   TRIG 172 (A) 02/06/2020   Last hemoglobin A1c Lab Results  Component Value Date   HGBA1C 6.6 (H) 07/16/2020   Last thyroid functions Lab Results  Component Value Date   TSH 1.240 07/16/2020      Objective    BP 122/72 (BP Location: Left Arm, Patient Position: Sitting, Cuff Size: Normal)   Pulse 70   Temp 97.9 F (36.6 C) (Oral)   Resp 16   Wt 175 lb (79.4 kg)   SpO2 99%   BMI 27.41 kg/m  BP Readings from Last 3 Encounters:  08/20/20 122/72  07/16/20 120/69    03/06/19 118/74   Wt Readings from Last 3 Encounters:  08/20/20 175 lb (79.4 kg)  07/16/20 173 lb (78.5 kg)  03/06/19 189 lb 4.8 oz (85.9 kg)      Physical Exam Vitals reviewed.  Constitutional:      General: She is not in acute distress.    Appearance: Normal appearance. She is well-developed. She is not diaphoretic.  HENT:     Head: Normocephalic and atraumatic.  Eyes:     General: No scleral icterus.    Conjunctiva/sclera: Conjunctivae normal.  Neck:     Thyroid: No thyromegaly.  Cardiovascular:     Rate and Rhythm: Normal rate and regular rhythm.     Pulses: Normal pulses.     Heart sounds: Normal heart sounds. No murmur heard.   Pulmonary:     Effort: Pulmonary effort is normal. No respiratory distress.     Breath sounds: Normal breath sounds. No wheezing, rhonchi or rales.  Musculoskeletal:     Cervical back: Neck supple.     Right lower leg: No edema.     Left lower leg: No edema.  Lymphadenopathy:     Cervical: No cervical adenopathy.  Skin:    General: Skin is warm and dry.     Findings: No rash.  Neurological:     Mental Status: She is alert and oriented to person, place, and time. Mental status is at baseline.  Psychiatric:        Mood and Affect: Mood normal.        Behavior: Behavior normal.       No results found for any visits on 08/20/20.  Assessment & Plan     Problem List Items Addressed This Visit      Digestive   Functional diarrhea - Primary    Likely 2/2 metformin No red flags Benign abd exam Recommend stopping metformin and see if it persists Could consider immodium prn in the future if persists despite stopping metformin        Endocrine   T2DM (type 2 diabetes mellitus) ([place])    Previously well controlled Stop Metformin as above for diarrhea We will continue current dose of Ozempic but may need to consider increasing it to accommodate for stopping Metformin      Relevant Medications   lisinopril (ZESTRIL) 5 MG tablet      Other   Insomnia    Longstanding issue Has taken Ambien for many years We did discuss the possible risks/harms associated with Ambien in the elderly Patient wishes to continue We will not plan to increase dose Refill given for 76-month supply of Ambien 5 mg nightly as needed today  Palpitations    New problem EKG today NSR Labs 1 month ago - normal Referral to cardiology for further eval including possible Zio patch      Relevant Orders   EKG 12-Lead   Ambulatory referral to Cardiology       Return in about 2 months (around 10/21/2020) for chronic disease f/u, as scheduled.      I, Lavon Paganini, MD, have reviewed all documentation for this visit. The documentation on 08/20/20 for the exam, diagnosis, procedures, and orders are all accurate and complete.   Athziri Freundlich, Dionne Bucy, MD, MPH [facility]

## 2020-08-22 ENCOUNTER — Ambulatory Visit (INDEPENDENT_AMBULATORY_CARE_PROVIDER_SITE_OTHER): Payer: Medicare Other | Admitting: Cardiovascular Disease

## 2020-08-22 ENCOUNTER — Telehealth: Payer: Self-pay | Admitting: Family Medicine

## 2020-08-22 ENCOUNTER — Encounter: Payer: Self-pay | Admitting: Cardiovascular Disease

## 2020-08-22 ENCOUNTER — Ambulatory Visit (INDEPENDENT_AMBULATORY_CARE_PROVIDER_SITE_OTHER): Payer: Medicare Other

## 2020-08-22 ENCOUNTER — Other Ambulatory Visit: Payer: Self-pay

## 2020-08-22 VITALS — BP 126/74 | HR 79 | Ht 67.5 in | Wt 175.2 lb

## 2020-08-22 DIAGNOSIS — R002 Palpitations: Secondary | ICD-10-CM

## 2020-08-22 DIAGNOSIS — I10 Essential (primary) hypertension: Secondary | ICD-10-CM

## 2020-08-22 MED ORDER — FREESTYLE TEST VI STRP
ORAL_STRIP | 1 refills | Status: DC
Start: 2020-08-22 — End: 2020-09-04

## 2020-08-22 MED ORDER — SEMAGLUTIDE(0.25 OR 0.5MG/DOS) 2 MG/1.5ML ~~LOC~~ SOPN
0.5000 mg | PEN_INJECTOR | SUBCUTANEOUS | 3 refills | Status: DC
Start: 2020-08-22 — End: 2020-10-29

## 2020-08-22 MED ORDER — FREESTYLE LANCETS MISC: 1 refills | Status: DC

## 2020-08-22 NOTE — Telephone Encounter (Signed)
Pt called asking for the Freestyle Lancets and strips to be sent to the [facility] at [address] and [address]  She asked that Dr. Jacinto Reap send the Ozempic to [facility].  She ask for 90 day supply or 3 months.  CB#  [phone]

## 2020-08-22 NOTE — Progress Notes (Unsigned)
Cardiology Office Note   Date:  08/22/2020   ID:  Raven Lewis, DOB 09-17-1934, MRN [id number]  PCP:  Virginia Crews, MD  Cardiologist:   Kathlyn Sacramento, MD   Chief Complaint  Patient presents with  . Other    Palpitations. Meds reviewed verbally with pt.      History of Present Illness: Raven Lewis is a 85 y.o. female who was referred by Dr. Brita Romp for evaluation of palpitations. She has known history of type 2 diabetes and essential hypertension. She has no prior cardiac history.  She is not a smoker and does not drink alcohol.  She does not drink any caffeinated products.  There is no family history of premature coronary artery disease or arrhythmia.  Recently, she started having intermittent palpitations and tachycardia which is most noticeable in the morning after she wakes up.  Occasionally, the symptoms get worse with exertion but she denies any chest pain or significant dyspnea.  She has occasional dizziness but no syncope or presyncope. She denies any recent stress or anxiety although after discussing with her, it appears that she does have some recent stresses related to family dynamics and her strained relationship with her son.   Past Medical History:  Diagnosis Date  . Allergic rhinitis   . Cataract   . Diabetes mellitus without complication ([place])   . Insomnia   . MRSA (methicillin resistant Staphylococcus aureus) 2000   right elbow  . Thrombocytopenia ([place])     Past Surgical History:  Procedure Laterality Date  . CATARACT EXTRACTION Left   . ELBOW SURGERY     MRSA in right elbow  . TUBAL LIGATION    . WRIST SURGERY     left wrist     Current Outpatient Medications  Medication Sig Dispense Refill  . clobetasol (TEMOVATE) 0.05 % external solution     . FREESTYLE TEST STRIPS test strip TEST TID UTD 100 each 1  . Lancets (FREESTYLE) lancets USE TID 100 each 1  . lisinopril (ZESTRIL) 5 MG tablet Take 1 tablet (5 mg total) by mouth daily.  90 tablet 3  . Loratadine 10 MG CAPS Take 10 mg by mouth daily as needed.    . Multiple Vitamin (MULTIVITAMIN) tablet Take 1 tablet by mouth daily.    Marland Kitchen olopatadine (PATANOL) 0.1 % ophthalmic solution INSTILL 1 DROP INTO BOTH EYES TWICE A DAY    . Semaglutide,0.25 or 0.5MG /DOS, 2 MG/1.5ML SOPN Inject 0.5 mg into the skin once a week. 4.5 mL 3  . zolpidem (AMBIEN) 5 MG tablet Take 1 tablet (5 mg total) by mouth at bedtime as needed. Do not fill <30 days from last refill 30 tablet 5   No current facility-administered medications for this visit.    Allergies:   Patient has no known allergies.    Social History:  The patient  reports that she quit smoking about 19 years ago. Her smoking use included cigarettes. She has a 60.00 pack-year smoking history. She has never used smokeless tobacco. She reports current alcohol use of about 1.0 standard drink of alcohol per week. She reports that she does not use drugs.   Family History:  The patient's family history includes Breast cancer (age of onset: 74) in her mother; Heart disease in her sister and sister; Lung cancer in her brother.    ROS:  Please see the history of present illness.   Otherwise, review of systems are positive for none.   All other systems are  reviewed and negative.    PHYSICAL EXAM: VS:  BP 126/74 (BP Location: Right Arm, Patient Position: Sitting, Cuff Size: Normal)   Pulse 79   Ht 5' 7.5" (1.715 m)   Wt 175 lb 4 oz (79.5 kg)   SpO2 98%   BMI 27.04 kg/m  , BMI Body mass index is 27.04 kg/m. GEN: Well nourished, well developed, in no acute distress  HEENT: normal  Neck: no JVD, carotid bruits, or masses Cardiac: RRR; no murmurs, rubs, or gallops,no edema  Respiratory:  clear to auscultation bilaterally, normal work of breathing GI: soft, nontender, nondistended, + BS MS: no deformity or atrophy  Skin: warm and dry, no rash Neuro:  Strength and sensation are intact Psych: euthymic mood, full affect   EKG:  EKG is  ordered today. The ekg ordered today demonstrates normal sinus rhythm with low voltage.  No significant ST or T wave changes.   Recent Labs: 07/16/2020: BUN 15; Creatinine, Ser 0.71; Hemoglobin 13.7; Platelets 153; Potassium 4.3; Sodium 139; TSH 1.240    Lipid Panel    Component Value Date/Time   CHOL 182 02/06/2020 0000   TRIG 172 (A) 02/06/2020 0000   HDL 59 02/06/2020 0000   LDLCALC 88 02/06/2020 0000      Wt Readings from Last 3 Encounters:  08/22/20 175 lb 4 oz (79.5 kg)  08/20/20 175 lb (79.4 kg)  07/16/20 173 lb (78.5 kg)      PAD Screen 08/22/2020  Previous PAD dx? No  Previous surgical procedure? No  Pain with walking? No  Feet/toe relief with dangling? No  Painful, non-healing ulcers? No  Extremities discolored? No      ASSESSMENT AND PLAN:  1.  Palpitations: Possible premature beats.  However, she reports intermittent tachycardia as well as may have to exclude underlying atrial fibrillation or supraventricular tachycardia.  Thus, I recommend evaluation with a 2-week ZIO monitor.  Recent labs and electrolytes were unremarkable.  In addition, TSH was normal.  She does not consume excessive amount of caffeine.  2.  Essential hypertension: Blood pressure is controlled.    Disposition:   FU with me as needed  Signed,  Kathlyn Sacramento, MD  08/22/2020 2:36 PM    [place]

## 2020-08-22 NOTE — Patient Instructions (Signed)
Medication Instructions:  Your physician recommends that you continue on your current medications as directed. Please refer to the Current Medication list given to you today.  *If you need a refill on your cardiac medications before your next appointment, please call your pharmacy*   Lab Work: None ordered If you have labs (blood work) drawn today and your tests are completely normal, you will receive your results only by: Marland Kitchen MyChart Message (if you have MyChart) OR . A paper copy in the mail If you have any lab test that is abnormal or we need to change your treatment, we will call you to review the results.   Testing/Procedures: Your physician has recommended that you wear a Zio monitor. (To be worn for 14 days) This monitor is a medical device that records the heart's electrical activity. Doctors most often use these monitors to diagnose arrhythmias. Arrhythmias are problems with the speed or rhythm of the heartbeat. The monitor is a small device applied to your chest. You can wear one while you do your normal daily activities. While wearing this monitor if you have any symptoms to push the button and record what you felt. Once you have worn this monitor for the period of time provider prescribed (Usually 14 days), you will return the monitor device in the postage paid box. Once it is returned they will download the data collected and provide [place] with a report which the provider will then review and we will call you with those results. Important tips:  1. Avoid showering during the first 24 hours of wearing the monitor. 2. Avoid excessive sweating to help maximize wear time. 3. Do not submerge the device, no hot tubs, and no swimming pools. 4. Keep any lotions or oils away from the patch. 5. After 24 hours you may shower with the patch on. Take brief showers with your back facing the shower head.  6. Do not remove patch once it has been placed because that will interrupt data and decrease  adhesive wear time. 7. Push the button when you have any symptoms and write down what you were feeling. 8. Once you have completed wearing your monitor, remove and place into box which has postage paid and place in your outgoing mailbox.  9. If for some reason you have misplaced your box then call our office and we can provide another box and/or mail it off for you.         Follow-Up: At [facility], you and your health needs are our priority.  As part of our continuing mission to provide you with exceptional heart care, we have created designated Provider Care Teams.  These Care Teams include your primary Cardiologist (physician) and Advanced Practice Providers (APPs -  Physician Assistants and Nurse Practitioners) who all work together to provide you with the care you need, when you need it.  We recommend signing up for the patient portal called "MyChart".  Sign up information is provided on this After Visit Summary.  MyChart is used to connect with patients for Virtual Visits (Telemedicine).  Patients are able to view lab/test results, encounter notes, upcoming appointments, etc.  Non-urgent messages can be sent to your provider as well.   To learn more about what you can do with MyChart, go to [email or web address].    Your next appointment:   As needed   The format for your next appointment:   In Person  Provider:   You may see Dr. Fletcher Anon or one of the following  Advanced Practice Providers on your designated Care Team:    Murray Hodgkins, NP  Christell Faith, PA-C  Marrianne Mood, PA-C  Cadence Nikolaevsk, [place]  Laurann Montana, NP    Other Instructions N/A

## 2020-08-29 ENCOUNTER — Other Ambulatory Visit: Payer: Self-pay | Admitting: Family Medicine

## 2020-08-29 NOTE — Telephone Encounter (Signed)
Medication Refill - Medication: zolpidem (AMBIEN) 5 MG tablet     Preferred Pharmacy (with phone number or street name):  [id number] - [place], [place] - [place] Phone:  [phone]  Fax:  [phone]       Agent: Please be advised that RX refills may take up to 3 business days. We ask that you follow-up with your pharmacy.

## 2020-08-29 NOTE — Telephone Encounter (Signed)
Requested medication (s) are due for refill today - no  Requested medication (s) are on the active medication list -yes  Future visit scheduled -yes  Last refill: 08/20/20  Notes to clinic:Rs request- non delegated Rx  Requested Prescriptions  Pending Prescriptions Disp Refills   zolpidem (AMBIEN) 5 MG tablet 30 tablet 5    Sig: Take 1 tablet (5 mg total) by mouth at bedtime as needed. Do not fill <30 days from last refill      Not Delegated - Psychiatry:  Anxiolytics/Hypnotics Failed - 08/29/2020  2:26 PM      Failed - This refill cannot be delegated      Failed - Urine Drug Screen completed in last 360 days      Passed - Valid encounter within last 6 months    Recent Outpatient Visits           1 week ago Functional diarrhea   [facility] [place], Dionne Bucy, MD   4 weeks ago Acute pharyngitis, unspecified etiology   [place], Vickki Muff, PA-C   1 month ago Type 2 diabetes mellitus with microalbuminuria, without long-term current use of insulin [facility])   [facility], Dionne Bucy, MD       Future Appointments             In 1 month Bacigalupo, Dionne Bucy, MD [facility]   In 3 months Ralene Bathe, MD [place]                 Requested Prescriptions  Pending Prescriptions Disp Refills   zolpidem (AMBIEN) 5 MG tablet 30 tablet 5    Sig: Take 1 tablet (5 mg total) by mouth at bedtime as needed. Do not fill <30 days from last refill      Not Delegated - Psychiatry:  Anxiolytics/Hypnotics Failed - 08/29/2020  2:26 PM      Failed - This refill cannot be delegated      Failed - Urine Drug Screen completed in last 360 days      Passed - Valid encounter within last 6 months    Recent Outpatient Visits           1 week ago Functional diarrhea   [facility], Dionne Bucy, MD   4 weeks ago Acute pharyngitis, unspecified etiology   [place], Vickki Muff, PA-C   1 month ago Type 2 diabetes mellitus with microalbuminuria, without long-term current use of insulin [facility])   [facility], Dionne Bucy, MD       Future Appointments             In 1 month Bacigalupo, Dionne Bucy, MD [facility], [place]   In 3 months Ralene Bathe, MD [place]

## 2020-08-30 MED ORDER — ZOLPIDEM TARTRATE 5 MG PO TABS
5.0000 mg | ORAL_TABLET | Freq: Every evening | ORAL | 5 refills | Status: DC | PRN
Start: 2020-08-30 — End: 2021-01-28

## 2020-08-30 NOTE — Telephone Encounter (Signed)
Just refilled (sent to pharmacy) on 12/7. They should have the refills available for the patient

## 2020-09-02 ENCOUNTER — Telehealth: Payer: Self-pay

## 2020-09-02 NOTE — Telephone Encounter (Signed)
Copied from [place] [id number]. Topic: General - Call Back - No Documentation >> Sep 02, 2020  4:27 PM Erick Blinks wrote: Reason for CRM: Pt called regarding her zolpidem (AMBIEN) 5 MG tablet, per her insurance she is required to have this authorized per [employer].   Needs office to call this number: [phone]  Her Best contact: [phone]

## 2020-09-03 ENCOUNTER — Ambulatory Visit: Payer: Self-pay

## 2020-09-03 NOTE — Telephone Encounter (Signed)
Let's schedule an appt to discuss diabetes and alternative medications to Metformin

## 2020-09-03 NOTE — Telephone Encounter (Signed)
188 this am Pt c/o elevated BS since taken off Metformin on 08/20/20. But not BS 180's. Denies blurred or double vision- just states vision is not as "clear." Pt is not having any other sx.  Called office to verify if pt needs appt versus restarting Metformin. Pt stated she would rather not be on Metformin d/t GI upset. Call warm transferred to office.  Pt asked about PA for Ambien- was advised PA was approved yesterday. Pt would like it called to  Pharmacy on file.  Reason for Disposition . [1] Caller has URGENT medication or insulin pump question AND [2] triager unable to answer question  Answer Assessment - Initial Assessment Questions 1. BLOOD GLUCOSE: "What is your blood glucose level?"      BS 180-190's daily am check only 2. ONSET: "When did you check the blood glucose?"     This am 0900 was 188 3. USUAL RANGE: "What is your glucose level usually?" (e.g., usual fasting morning value, usual evening value)     morning 4. KETONES: "Do you check for ketones (urine or blood test strips)?" If yes, ask: "What does the test show now?"      no 5. TYPE 1 or 2:  "Do you know what type of diabetes you have?"  (e.g., Type 1, Type 2, Gestational; doesn't know)      2 6. INSULIN: "Do you take insulin?" "What type of insulin(s) do you use? What is the mode of delivery? (syringe, pen; injection or pump)?"      no 7. DIABETES PILLS: "Do you take any pills for your diabetes?" If yes, ask: "Have you missed taking any pills recently?"     No was dc'd 08/20/20 8. OTHER SYMPTOMS: "Do you have any symptoms?" (e.g., fever, frequent urination, difficulty breathing, dizziness, weakness, vomiting)     Cannot see as well   9. PREGNANCY: "Is there any chance you are pregnant?" "When was your last menstrual period?"     n/a  Protocols used: DIABETES - HIGH BLOOD SUGAR-A-AH

## 2020-09-03 NOTE — Telephone Encounter (Signed)
PA done and approved.

## 2020-09-04 ENCOUNTER — Other Ambulatory Visit: Payer: Self-pay

## 2020-09-04 DIAGNOSIS — R809 Proteinuria, unspecified: Secondary | ICD-10-CM

## 2020-09-04 MED ORDER — FREESTYLE TEST VI STRP: ORAL_STRIP | 2 refills | Status: DC

## 2020-09-04 MED ORDER — FREESTYLE LANCETS MISC: 2 refills | Status: DC

## 2020-09-05 DIAGNOSIS — R002 Palpitations: Secondary | ICD-10-CM

## 2020-09-16 ENCOUNTER — Other Ambulatory Visit: Payer: Self-pay

## 2020-09-16 ENCOUNTER — Ambulatory Visit (INDEPENDENT_AMBULATORY_CARE_PROVIDER_SITE_OTHER): Payer: Medicare Other | Admitting: Family Medicine

## 2020-09-16 ENCOUNTER — Encounter: Payer: Self-pay | Admitting: Family Medicine

## 2020-09-16 VITALS — BP 130/78 | HR 76 | Temp 97.7°F | Resp 16 | Wt 176.0 lb

## 2020-09-16 DIAGNOSIS — E1129 Type 2 diabetes mellitus with other diabetic kidney complication: Secondary | ICD-10-CM | POA: Diagnosis not present

## 2020-09-16 DIAGNOSIS — R809 Proteinuria, unspecified: Secondary | ICD-10-CM

## 2020-09-16 MED ORDER — OZEMPIC (1 MG/DOSE) 2 MG/1.5ML ~~LOC~~ SOPN: 1.0000 mg | PEN_INJECTOR | SUBCUTANEOUS | 3 refills | Status: DC

## 2020-09-16 NOTE — Progress Notes (Signed)
Established patient visit   Patient: Raven Lewis   DOB: [birth year]   86 y.o. Female  MRN: [id number] Visit Date: 09/16/2020  Today's healthcare provider: Shirlee Latch, MD   Chief Complaint  Patient presents with   Blood Sugar Problem   Diabetes   Subjective    HPI   Joyelle stopped taking her metformin in September due to its side effects and presents today with concerns for high blood sugar. She notes that her fasting blood sugars have been consistently in the 180s-190s. She has experienced some blurry vision, and vertigo that has been intermittent for the last 2 months. She has had no other symptoms.   Notably, in the past she has tried Januvia and experienced a yeast infection as an adverse side effect.   Social History   Tobacco Use   Smoking status: Former Smoker    Packs/day: 1.50    Years: 40.00    Pack years: 60.00    Types: Cigarettes    Quit date: 11/29/2000    Years since quitting: 19.8   Smokeless tobacco: Never Used  Vaping Use   Vaping Use: Never used  Substance Use Topics   Alcohol use: Yes    Alcohol/week: 1.0 standard drink    Types: 1 Glasses of wine per week    Comment: occasionally, less than weekly   Drug use: Never       Medications: Outpatient Medications Prior to Visit  Medication Sig   clobetasol (TEMOVATE) 0.05 % external solution    FREESTYLE TEST STRIPS test strip TEST TID UTD   Lancets (FREESTYLE) lancets USE TID   lisinopril (ZESTRIL) 5 MG tablet Take 1 tablet (5 mg total) by mouth daily.   Loratadine 10 MG CAPS Take 10 mg by mouth daily as needed.   Multiple Vitamin (MULTIVITAMIN) tablet Take 1 tablet by mouth daily.   olopatadine (PATANOL) 0.1 % ophthalmic solution INSTILL 1 DROP INTO BOTH EYES TWICE A DAY   Semaglutide,0.25 or 0.5MG /DOS, 2 MG/1.5ML SOPN Inject 0.5 mg into the skin once a week.   zolpidem (AMBIEN) 5 MG tablet Take 1 tablet (5 mg total) by mouth at bedtime as needed. Do not fill <30 days  from last refill   No facility-administered medications prior to visit.    Review of Systems  Constitutional: Negative.   HENT: Negative.   Eyes: Positive for visual disturbance.  Respiratory: Negative.   Cardiovascular: Negative.   Gastrointestinal: Negative.   Endocrine: Negative.   Genitourinary: Negative.   Musculoskeletal: Negative.   Skin: Negative.   Allergic/Immunologic: Negative.   Neurological: Positive for dizziness.  Hematological: Negative.   Psychiatric/Behavioral: Negative.       Objective    BP 130/78 (BP Location: Left Arm, Patient Position: Sitting, Cuff Size: Normal)    Pulse 76    Temp 97.7 F (36.5 C) (Oral)    Resp 16    Wt 176 lb (79.8 kg)    SpO2 99%    BMI 27.16 kg/m    Physical Exam   General: Well appearing, conversational, in no distress Lungs: CTA bilaterally, normal work of breathing on RA Cards: RRR no murmurs rubs or gallops, no leg swelling GI: Soft Non-tender, Non-distended   No results found for any visits on 09/16/20.  Assessment & Plan     Problem List Items Addressed This Visit      Endocrine   T2DM (type 2 diabetes mellitus) (HCC) - Primary    Previously well controlled High fasting  blood sugars in 180s-190  after stopping metforin Also reports poor diet during the holidays Increase Ozempic to 1 mg 1/week Recheck A1c in 1 month      Relevant Medications   Semaglutide, 1 MG/DOSE, (OZEMPIC, 1 MG/DOSE,) 2 MG/1.5ML SOPN       Return in about 4 weeks (around 10/14/2020) for chronic disease f/u, as scheduled.      Patient seen along with [employer] student [facility]. I personally evaluated this patient along with the student, and verified all aspects of the history, physical exam, and medical decision making as documented by the student. I agree with the student's documentation and have made all necessary edits.  Maan Zarcone, Dionne Bucy, MD, MPH [facility]

## 2020-09-16 NOTE — Assessment & Plan Note (Addendum)
Previously well controlled High fasting blood sugars in 180s-190  after stopping metforin Also reports poor diet during the holidays Increase Ozempic to 1 mg 1/week Recheck A1c in 1 month

## 2020-10-02 ENCOUNTER — Telehealth: Payer: Self-pay

## 2020-10-02 NOTE — Telephone Encounter (Signed)
-----   Message from Wellington Hampshire, MD sent at 10/01/2020 12:38 PM EST ----- Inform patient that monitor showed short runs of supraventricular tachycardia.  Each episode lasted less than 10 seconds.  Overall, this is not a serious arrhythmia and does not require treatment unless she is still having symptoms.  If she is still having palpitations, it would be reasonable to try metoprolol tartrate 12.5 mg twice daily and arrange follow-up in few months to evaluate response.

## 2020-10-02 NOTE — Telephone Encounter (Signed)
Called to give the patient cardiac monitor results. lmtcb. 

## 2020-10-02 NOTE — Telephone Encounter (Signed)
Called the pt lmtcb. 

## 2020-10-02 NOTE — Telephone Encounter (Signed)
Patient is returning your call.  

## 2020-10-02 NOTE — Telephone Encounter (Signed)
Have her follow-up in 6 months to make sure things are stable.

## 2020-10-02 NOTE — Telephone Encounter (Signed)
Spoke with the patient.  Patient made aware of cardiac monitor results.   Pt sts that she is doing well and has not noticed any reoccurrence of palpitations.  Adv the patient that I will fwd an update to Dr. Fletcher Anon to give his recommendation regarding follow up.  Patient agreeable with the plan.

## 2020-10-03 NOTE — Telephone Encounter (Addendum)
DPR on file. lmom with Dr. Tyrell Antonio response. Adv that she is to f/u in 6 months. We arwe not able to schedule 6 months appts at this time. She will receive a call or a reminder letter in the mail. 6 mo recall placed in Epic.

## 2020-10-16 DIAGNOSIS — C439 Malignant melanoma of skin, unspecified: Secondary | ICD-10-CM

## 2020-10-16 HISTORY — DX: Malignant melanoma of skin, unspecified: C43.9

## 2020-10-17 ENCOUNTER — Ambulatory Visit: Payer: Medicare Other | Admitting: Family Medicine

## 2020-10-28 NOTE — Patient Instructions (Signed)
Diabetes Mellitus and Nutrition, Adult When you have diabetes, or diabetes mellitus, it is very important to have healthy eating habits because your blood sugar (glucose) levels are greatly affected by what you eat and drink. Eating healthy foods in the right amounts, at about the same times every day, can help you:  Control your blood glucose.  Lower your risk of heart disease.  Improve your blood pressure.  Reach or maintain a healthy weight. What can affect my meal plan? Every person with diabetes is different, and each person has different needs for a meal plan. Your health care provider may recommend that you work with a dietitian to make a meal plan that is best for you. Your meal plan may vary depending on factors such as:  The calories you need.  The medicines you take.  Your weight.  Your blood glucose, blood pressure, and cholesterol levels.  Your activity level.  Other health conditions you have, such as heart or kidney disease. How do carbohydrates affect me? Carbohydrates, also called carbs, affect your blood glucose level more than any other type of food. Eating carbs naturally raises the amount of glucose in your blood. Carb counting is a method for keeping track of how many carbs you eat. Counting carbs is important to keep your blood glucose at a healthy level, especially if you use insulin or take certain oral diabetes medicines. It is important to know how many carbs you can safely have in each meal. This is different for every person. Your dietitian can help you calculate how many carbs you should have at each meal and for each snack. How does alcohol affect me? Alcohol can cause a sudden decrease in blood glucose (hypoglycemia), especially if you use insulin or take certain oral diabetes medicines. Hypoglycemia can be a life-threatening condition. Symptoms of hypoglycemia, such as sleepiness, dizziness, and confusion, are similar to symptoms of having too much  alcohol.  Do not drink alcohol if: ? Your health care provider tells you not to drink. ? You are pregnant, may be pregnant, or are planning to become pregnant.  If you drink alcohol: ? Do not drink on an empty stomach. ? Limit how much you use to:  0-1 drink a day for women.  0-2 drinks a day for men. ? Be aware of how much alcohol is in your drink. In the [place] one drink equals one 12 oz bottle of beer (355 mL), one 5 oz glass of wine (148 mL), or one 1 oz glass of hard liquor (44 mL). ? Keep yourself hydrated with water, diet soda, or unsweetened iced tea.  Keep in mind that regular soda, juice, and other mixers may contain a lot of sugar and must be counted as carbs. What are tips for following this plan? Reading food labels  Start by checking the serving size on the "Nutrition Facts" label of packaged foods and drinks. The amount of calories, carbs, fats, and other nutrients listed on the label is based on one serving of the item. Many items contain more than one serving per package.  Check the total grams (g) of carbs in one serving. You can calculate the number of servings of carbs in one serving by dividing the total carbs by 15. For example, if a food has 30 g of total carbs per serving, it would be equal to 2 servings of carbs.  Check the number of grams (g) of saturated fats and trans fats in one serving. Choose foods that have   a low amount or none of these fats.  Check the number of milligrams (mg) of salt (sodium) in one serving. Most people should limit total sodium intake to less than 2,300 mg per day.  Always check the nutrition information of foods labeled as "low-fat" or "nonfat." These foods may be higher in added sugar or refined carbs and should be avoided.  Talk to your dietitian to identify your daily goals for nutrients listed on the label. Shopping  Avoid buying canned, pre-made, or processed foods. These foods tend to be high in fat, sodium, and added  sugar.  Shop around the outside edge of the grocery store. This is where you will most often find fresh fruits and vegetables, bulk grains, fresh meats, and fresh dairy. Cooking  Use low-heat cooking methods, such as baking, instead of high-heat cooking methods like deep frying.  Cook using healthy oils, such as olive, canola, or sunflower oil.  Avoid cooking with butter, cream, or high-fat meats. Meal planning  Eat meals and snacks regularly, preferably at the same times every day. Avoid going long periods of time without eating.  Eat foods that are high in fiber, such as fresh fruits, vegetables, beans, and whole grains. Talk with your dietitian about how many servings of carbs you can eat at each meal.  Eat 4-6 oz (112-168 g) of lean protein each day, such as lean meat, chicken, fish, eggs, or tofu. One ounce (oz) of lean protein is equal to: ? 1 oz (28 g) of meat, chicken, or fish. ? 1 egg. ?  cup (62 g) of tofu.  Eat some foods each day that contain healthy fats, such as avocado, nuts, seeds, and fish.   What foods should I eat? Fruits Berries. Apples. Oranges. Peaches. Apricots. Plums. Grapes. Mango. Papaya. Pomegranate. Kiwi. Cherries. Vegetables Lettuce. Spinach. Leafy greens, including kale, chard, collard greens, and mustard greens. Beets. Cauliflower. Cabbage. Broccoli. Carrots. Green beans. Tomatoes. Peppers. Onions. Cucumbers. Brussels sprouts. Grains Whole grains, such as whole-wheat or whole-grain bread, crackers, tortillas, cereal, and pasta. Unsweetened oatmeal. Quinoa. Brown or wild rice. Meats and other proteins Seafood. Poultry without skin. Lean cuts of poultry and beef. Tofu. Nuts. Seeds. Dairy Low-fat or fat-free dairy products such as milk, yogurt, and cheese. The items listed above may not be a complete list of foods and beverages you can eat. Contact a dietitian for more information. What foods should I avoid? Fruits Fruits canned with  syrup. Vegetables Canned vegetables. Frozen vegetables with butter or cream sauce. Grains Refined white flour and flour products such as bread, pasta, snack foods, and cereals. Avoid all processed foods. Meats and other proteins Fatty cuts of meat. Poultry with skin. Breaded or fried meats. Processed meat. Avoid saturated fats. Dairy Full-fat yogurt, cheese, or milk. Beverages Sweetened drinks, such as soda or iced tea. The items listed above may not be a complete list of foods and beverages you should avoid. Contact a dietitian for more information. Questions to ask a health care provider  Do I need to meet with a diabetes educator?  Do I need to meet with a dietitian?  What number can I call if I have questions?  When are the best times to check my blood glucose? Where to find more information:  American Diabetes Association: diabetes.org  Academy of Nutrition and Dietetics: www.eatright.org  National Institute of Diabetes and Digestive and Kidney Diseases: www.niddk.nih.gov  Association of Diabetes Care and Education Specialists: www.diabeteseducator.org Summary  It is important to have healthy eating   habits because your blood sugar (glucose) levels are greatly affected by what you eat and drink.  A healthy meal plan will help you control your blood glucose and maintain a healthy lifestyle.  Your health care provider may recommend that you work with a dietitian to make a meal plan that is best for you.  Keep in mind that carbohydrates (carbs) and alcohol have immediate effects on your blood glucose levels. It is important to count carbs and to use alcohol carefully. This information is not intended to replace advice given to you by your health care provider. Make sure you discuss any questions you have with your health care provider. Document Revised: 08/08/2019 Document Reviewed: 08/08/2019 Elsevier Patient Education  2021 [employer].  

## 2020-10-28 NOTE — Progress Notes (Signed)
Established patient visit   Patient: Raven Lewis   DOB: [birth year]   86 y.o. Female  MRN: [id number] Visit Date: 10/29/2020  Today's healthcare provider: Lavon Paganini, MD   Chief Complaint  Patient presents with   Diabetes   Subjective    HPI  Diabetes Mellitus Type II, Follow-up  Lab Results  Component Value Date   HGBA1C 6.8 (A) 10/29/2020   HGBA1C 6.6 (H) 07/16/2020   HGBA1C 6.8 02/06/2020   Wt Readings from Last 3 Encounters:  10/29/20 174 lb (78.9 kg)  09/16/20 176 lb (79.8 kg)  08/22/20 175 lb 4 oz (79.5 kg)   Last seen for diabetes 1 months ago.  Management since then includes increase Ozempic to 1 mg/wk. She reports good compliance with treatment. She is not having side effects.  Symptoms: No fatigue No foot ulcerations  No appetite changes No nausea  No paresthesia of the feet  No polydipsia  No polyuria No visual disturbances   No vomiting     Home blood sugar records: fasting range: 150's-170's  Episodes of hypoglycemia? No    Current insulin regiment: none Most Recent Eye Exam Is due now   Pertinent Labs: Lab Results  Component Value Date   CHOL 182 02/06/2020   HDL 59 02/06/2020   LDLCALC 88 02/06/2020   TRIG 172 (A) 02/06/2020   Lab Results  Component Value Date   NA 139 07/16/2020   K 4.3 07/16/2020   CREATININE 0.71 07/16/2020   GFRNONAA 79 07/16/2020   GFRAA 91 07/16/2020   GLUCOSE 135 (H) 07/16/2020     --------------------------------------------------------------------------------------------------- Patient has recently been diagnosed with stage 3 Melanoma.  Being followed at [facility].  Social History   Tobacco Use   Smoking status: Former Smoker    Packs/day: 1.50    Years: 40.00    Pack years: 60.00    Types: Cigarettes    Quit date: 11/29/2000    Years since quitting: 19.9   Smokeless tobacco: Never Used  Vaping Use   Vaping Use: Never used  Substance Use Topics   Alcohol use: Yes    Alcohol/week:  1.0 standard drink    Types: 1 Glasses of wine per week    Comment: occasionally, less than weekly   Drug use: Never       Medications: Outpatient Medications Prior to Visit  Medication Sig   clobetasol (TEMOVATE) 0.05 % external solution    FREESTYLE TEST STRIPS test strip TEST TID UTD   Lancets (FREESTYLE) lancets USE TID   Loratadine 10 MG CAPS Take 10 mg by mouth daily as needed.   Multiple Vitamin (MULTIVITAMIN) tablet Take 1 tablet by mouth daily.   olopatadine (PATANOL) 0.1 % ophthalmic solution INSTILL 1 DROP INTO BOTH EYES TWICE A DAY   zolpidem (AMBIEN) 5 MG tablet Take 1 tablet (5 mg total) by mouth at bedtime as needed. Do not fill <30 days from last refill   [DISCONTINUED] Semaglutide,0.25 or 0.5MG /DOS, 2 MG/1.5ML SOPN Inject 0.5 mg into the skin once a week.   [DISCONTINUED] lisinopril (ZESTRIL) 5 MG tablet Take 1 tablet (5 mg total) by mouth daily. (Patient not taking: Reported on 10/29/2020)   [DISCONTINUED] Semaglutide, 1 MG/DOSE, (OZEMPIC, 1 MG/DOSE,) 2 MG/1.5ML SOPN Inject 1 mg into the skin once a week. (Patient not taking: Reported on 10/29/2020)   No facility-administered medications prior to visit.    Review of Systems      Objective    BP 132/66 (BP Location: Left  Arm, Patient Position: Sitting, Cuff Size: Normal)    Pulse 80    Temp 98.1 F (36.7 C) (Oral)    Wt 174 lb (78.9 kg)    SpO2 100%    BMI 26.85 kg/m    Physical Exam Vitals reviewed.  Constitutional:      General: She is not in acute distress.    Appearance: Normal appearance. She is well-developed. She is not diaphoretic.  HENT:     Head: Normocephalic and atraumatic.  Eyes:     General: No scleral icterus.    Conjunctiva/sclera: Conjunctivae normal.  Neck:     Thyroid: No thyromegaly.  Cardiovascular:     Rate and Rhythm: Normal rate and regular rhythm.     Pulses: Normal pulses.     Heart sounds: Normal heart sounds. No murmur heard.   Pulmonary:     Effort: Pulmonary  effort is normal. No respiratory distress.     Breath sounds: Normal breath sounds. No wheezing, rhonchi or rales.  Musculoskeletal:     Cervical back: Neck supple.     Right lower leg: No edema.     Left lower leg: No edema.  Lymphadenopathy:     Cervical: No cervical adenopathy.  Skin:    General: Skin is warm and dry.     Comments: Surgical wound on scalp clean and dry with no signs of infection  Neurological:     Mental Status: She is alert and oriented to person, place, and time. Mental status is at baseline.  Psychiatric:        Mood and Affect: Mood normal.        Behavior: Behavior normal.     Diabetic Foot Exam - Simple   Simple Foot Form Diabetic Foot exam was performed with the following findings: Yes 10/29/2020  8:45 AM  Visual Inspection No deformities, no ulcerations, no other skin breakdown bilaterally: Yes Sensation Testing Intact to touch and monofilament testing bilaterally: Yes Pulse Check Posterior Tibialis and Dorsalis pulse intact bilaterally: Yes Comments      Results for orders placed or performed in visit on 10/29/20  POCT glycosylated hemoglobin (Hb A1C)  Result Value Ref Range   Hemoglobin A1C 6.8 (A) 4.0 - 5.6 %   HbA1c POC (<> result, manual entry)     HbA1c, POC (prediabetic range)     HbA1c, POC (controlled diabetic range)      Assessment & Plan     Problem List Items Addressed This Visit      Digestive   Functional diarrhea    Resolved since stopping Metformin        Endocrine   T2DM (type 2 diabetes mellitus) ([place]) - Primary    Well-controlled This is despite reportedly poor diet recently Encouraged improvements in diet for healing with upcoming melanoma treatment Continue Ozempic 1 mg weekly Foot exam completed today Encouraged scheduling eye exam Up-to-date on vaccinations On statin Follow-up in 3 months      Relevant Medications   Semaglutide, 1 MG/DOSE, (OZEMPIC, 1 MG/DOSE,) 2 MG/1.5ML SOPN   Other Relevant Orders    POCT glycosylated hemoglobin (Hb A1C) (Completed)   Microalbuminuria due to type 2 diabetes mellitus ([place])    Reports that she felt very tired and lethargic and not herself on lisinopril and self discontinued Does not want to try ARB at this time We will continue to monitor Repeat urine microalbumin at next visit      Relevant Medications   Semaglutide, 1 MG/DOSE, (OZEMPIC, 1 MG/DOSE,)  2 MG/1.5ML SOPN     Other   Melanoma ([place])    Recent diagnosis Undergoing further work-up with PET scan and brain MRI at [facility]          Return in about 3 months (around 01/26/2021) for AWV, chronic disease f/u.      I, Lavon Paganini, MD, have reviewed all documentation for this visit. The documentation on 10/29/20 for the exam, diagnosis, procedures, and orders are all accurate and complete.   Raven Lewis, Dionne Bucy, MD, MPH [facility]

## 2020-10-29 ENCOUNTER — Encounter: Payer: Self-pay | Admitting: Family Medicine

## 2020-10-29 ENCOUNTER — Ambulatory Visit (INDEPENDENT_AMBULATORY_CARE_PROVIDER_SITE_OTHER): Payer: Medicare Other | Admitting: Family Medicine

## 2020-10-29 ENCOUNTER — Other Ambulatory Visit: Payer: Self-pay

## 2020-10-29 VITALS — BP 132/66 | HR 80 | Temp 98.1°F | Wt 174.0 lb

## 2020-10-29 DIAGNOSIS — C434 Malignant melanoma of scalp and neck: Secondary | ICD-10-CM

## 2020-10-29 DIAGNOSIS — R809 Proteinuria, unspecified: Secondary | ICD-10-CM | POA: Diagnosis not present

## 2020-10-29 DIAGNOSIS — K591 Functional diarrhea: Secondary | ICD-10-CM

## 2020-10-29 DIAGNOSIS — E1129 Type 2 diabetes mellitus with other diabetic kidney complication: Secondary | ICD-10-CM | POA: Diagnosis not present

## 2020-10-29 LAB — POCT GLYCOSYLATED HEMOGLOBIN (HGB A1C): Hemoglobin A1C: 6.8 % — AB (ref 4.0–5.6)

## 2020-10-29 MED ORDER — OZEMPIC (1 MG/DOSE) 2 MG/1.5ML ~~LOC~~ SOPN
1.0000 mg | PEN_INJECTOR | SUBCUTANEOUS | 3 refills | Status: DC
Start: 2020-10-29 — End: 2022-01-06

## 2020-10-29 NOTE — Assessment & Plan Note (Signed)
Well-controlled This is despite reportedly poor diet recently Encouraged improvements in diet for healing with upcoming melanoma treatment Continue Ozempic 1 mg weekly Foot exam completed today Encouraged scheduling eye exam Up-to-date on vaccinations On statin Follow-up in 3 months

## 2020-10-29 NOTE — Assessment & Plan Note (Addendum)
Reports that she felt very tired and lethargic and not herself on lisinopril and self discontinued Does not want to try ARB at this time We will continue to monitor Repeat urine microalbumin at next visit

## 2020-10-29 NOTE — Assessment & Plan Note (Signed)
Recent diagnosis Undergoing further work-up with PET scan and brain MRI at [facility]

## 2020-10-29 NOTE — Assessment & Plan Note (Signed)
Resolved since stopping Metformin

## 2020-11-18 DIAGNOSIS — C434 Malignant melanoma of scalp and neck: Secondary | ICD-10-CM | POA: Insufficient documentation

## 2020-12-04 ENCOUNTER — Other Ambulatory Visit: Payer: Self-pay

## 2020-12-04 ENCOUNTER — Ambulatory Visit (INDEPENDENT_AMBULATORY_CARE_PROVIDER_SITE_OTHER): Payer: Medicare Other | Admitting: Dermatology

## 2020-12-04 DIAGNOSIS — C434 Malignant melanoma of scalp and neck: Secondary | ICD-10-CM

## 2020-12-04 DIAGNOSIS — L578 Other skin changes due to chronic exposure to nonionizing radiation: Secondary | ICD-10-CM | POA: Diagnosis not present

## 2020-12-04 DIAGNOSIS — L821 Other seborrheic keratosis: Secondary | ICD-10-CM

## 2020-12-04 DIAGNOSIS — Z1283 Encounter for screening for malignant neoplasm of skin: Secondary | ICD-10-CM | POA: Diagnosis not present

## 2020-12-04 DIAGNOSIS — C439 Malignant melanoma of skin, unspecified: Secondary | ICD-10-CM

## 2020-12-04 DIAGNOSIS — L820 Inflamed seborrheic keratosis: Secondary | ICD-10-CM

## 2020-12-04 DIAGNOSIS — D1800 Hemangioma unspecified site: Secondary | ICD-10-CM

## 2020-12-04 DIAGNOSIS — L814 Other melanin hyperpigmentation: Secondary | ICD-10-CM | POA: Diagnosis not present

## 2020-12-04 DIAGNOSIS — D229 Melanocytic nevi, unspecified: Secondary | ICD-10-CM

## 2020-12-04 NOTE — Patient Instructions (Addendum)

## 2020-12-04 NOTE — Progress Notes (Signed)
   New Patient Visit  Subjective  Raven Lewis is a 86 y.o. female who presents for the following: Skin Problem (New pt recently diagnosed with Melanoma stage 3-4 of the scalp, currently undergoing treatment at [facility] with Talimogene Laherparepvec (tah-LIH-moh-jeen lah-HER-pah-REP-vek) is also known as Imlygic or T-VEC. It is injected directly into your tumor(s). It is a weakened form of Herpes Simplex Virus Type 1, which is commonly called the cold sore virus.). Labs reviewed: PET scan negative MRI negative  The patient presents for Total-Body Skin Exam (TBSE) for skin cancer screening and mole check.  The following portions of the chart were reviewed this encounter and updated as appropriate:   Tobacco  Allergies  Meds  Problems  Med Hx  Surg Hx  Fam Hx     Review of Systems:  No other skin or systemic complaints except as noted in HPI or Assessment and Plan.  Objective  Well appearing patient in no apparent distress; mood and affect are within normal limits.  A full examination was performed including scalp, head, eyes, ears, nose, lips, neck, chest, axillae, abdomen, back, buttocks, bilateral upper extremities, bilateral lower extremities, hands, feet, fingers, toes, fingernails, and toenails. All findings within normal limits unless otherwise noted below.  Objective  Scalp: ~2.7 cm dark plaque          Objective  Left calf: Erythematous keratotic or waxy stuck-on papule or plaque.    Assessment & Plan  Malignant melanoma of skin (HCC) Scalp No lymphadenopathy.  Continue melanoma treatments at [facility] with T-Vec.  Inflamed seborrheic keratosis Left calf Destruction of lesion - Left calf Complexity: simple   Destruction method: cryotherapy   Informed consent: discussed and consent obtained   Timeout:  patient name, date of birth, surgical site, and procedure verified Lesion destroyed using liquid nitrogen: Yes   Region frozen until ice ball  extended beyond lesion: Yes   Outcome: patient tolerated procedure well with no complications   Post-procedure details: wound care instructions given     Lentigines - Scattered tan macules - Due to sun exposure - Benign-appering, observe - Recommend daily broad spectrum sunscreen SPF 30+ to sun-exposed areas, reapply every 2 hours as needed. - Call for any changes  Seborrheic Keratoses - Stuck-on, waxy, tan-brown papules and plaques  - Discussed benign etiology and prognosis. - Observe - Call for any changes  Melanocytic Nevi - Tan-brown and/or pink-flesh-colored symmetric macules and papules - Benign appearing on exam today - Observation - Call clinic for new or changing moles - Recommend daily use of broad spectrum spf 30+ sunscreen to sun-exposed areas.   Hemangiomas - Red papules - Discussed benign nature - Observe - Call for any changes  Actinic Damage - Chronic, secondary to cumulative UV/sun exposure - diffuse scaly erythematous macules with underlying dyspigmentation - Recommend daily broad spectrum sunscreen SPF 30+ to sun-exposed areas, reapply every 2 hours as needed.  - Call for new or changing lesions.  Skin cancer screening performed today.  Return in about 3 months (around 03/06/2021) for TBSE .  IMarye Round, CMA, am acting as scribe for Sarina Ser, MD .  Documentation: I have reviewed the above documentation for accuracy and completeness, and I agree with the above.  Sarina Ser, MD

## 2020-12-10 ENCOUNTER — Encounter: Payer: Self-pay | Admitting: Dermatology

## 2021-01-28 ENCOUNTER — Ambulatory Visit: Payer: Medicare Other

## 2021-01-28 ENCOUNTER — Encounter: Payer: Self-pay | Admitting: Family Medicine

## 2021-01-28 ENCOUNTER — Ambulatory Visit (INDEPENDENT_AMBULATORY_CARE_PROVIDER_SITE_OTHER): Payer: Medicare Other | Admitting: Family Medicine

## 2021-01-28 ENCOUNTER — Other Ambulatory Visit: Payer: Self-pay

## 2021-01-28 VITALS — BP 158/80 | HR 75 | Temp 97.7°F | Resp 15 | Wt 173.3 lb

## 2021-01-28 DIAGNOSIS — E663 Overweight: Secondary | ICD-10-CM | POA: Diagnosis not present

## 2021-01-28 DIAGNOSIS — R809 Proteinuria, unspecified: Secondary | ICD-10-CM

## 2021-01-28 DIAGNOSIS — E1129 Type 2 diabetes mellitus with other diabetic kidney complication: Secondary | ICD-10-CM

## 2021-01-28 DIAGNOSIS — G4700 Insomnia, unspecified: Secondary | ICD-10-CM | POA: Diagnosis not present

## 2021-01-28 MED ORDER — ZOLPIDEM TARTRATE 5 MG PO TABS: 5.0000 mg | ORAL_TABLET | Freq: Every evening | ORAL | 5 refills | Status: DC | PRN

## 2021-01-28 NOTE — Assessment & Plan Note (Signed)
Discussed importance of healthy weight management Discussed diet and exercise  

## 2021-01-28 NOTE — Progress Notes (Signed)
Established patient visit   Patient: Raven Lewis   DOB: [birth year]   86 y.o. Female  MRN: [id number] Visit Date: 01/28/2021  Today's healthcare provider: Lavon Paganini, MD   Chief Complaint  Patient presents with  . Diabetes   Subjective    Diabetes Hypoglycemia symptoms include dizziness. Pertinent negatives for hypoglycemia include no headaches or seizures. Pertinent negatives for diabetes include no chest pain, no fatigue and no weakness.  She monitors her sugar levels and says they are in the 180 range. She says she doesn't monitor her diet as well as she could although she does try to walk everyday.  Blood Pressure  She noticed her blood pressure is elevated when she is going to her cancer treatments. She is unsure if her hypertension is related to the dizziness.   Constipation She has been experiencing constipation and she takes 1 metformin to help her have softer bowel movements. She drinks a glass of prune juice in the morning to improve her symptoms however her stool is still hard. She was using Miralax that was helping her symptoms but she stopped using it.   Melanoma She is currently undergoing treatments to treat her melanoma on her head.This Monday she has an appointment with an oncologist to biopsy the area to determine if she needs to change treatment before progression.   Sleep She is currently taking Ambien and is sleeping well.      Diabetes Mellitus Type II, Follow-up  Lab Results  Component Value Date   HGBA1C 6.8 (A) 10/29/2020   HGBA1C 6.6 (H) 07/16/2020   HGBA1C 6.8 02/06/2020   Wt Readings from Last 3 Encounters:  01/28/21 173 lb 4.8 oz (78.6 kg)  10/29/20 174 lb (78.9 kg)  09/16/20 176 lb (79.8 kg)   Last seen for diabetes 3 months ago.  Management since then includes none, patient to continue Ozempic. She reports excellent compliance with treatment. She is not having side effects.  Symptoms: Yes fatigue No foot ulcerations   No appetite changes No nausea  No paresthesia of the feet  No polydipsia  No polyuria No visual disturbances   No vomiting     Home blood sugar records: fasting range: 186-189  Episodes of hypoglycemia? No    Current insulin regiment: none Most Recent Eye Exam: DUE >1 YEAR Current exercise: no regular exercise Current diet habits: not asked  Pertinent Labs: Lab Results  Component Value Date   CHOL 182 02/06/2020   HDL 59 02/06/2020   LDLCALC 88 02/06/2020   TRIG 172 (A) 02/06/2020   Lab Results  Component Value Date   NA 139 07/16/2020   K 4.3 07/16/2020   CREATININE 0.71 07/16/2020   GFRNONAA 79 07/16/2020   GFRAA 91 07/16/2020   GLUCOSE 135 (H) 07/16/2020     ---------------------------------------------------------------------------------------------------  Patient Active Problem List   Diagnosis Date Noted  . Palpitations 08/20/2020  . Overweight 07/16/2020  . Functional diarrhea 07/16/2020  . Melanoma ([place]) 07/16/2020  . Microalbuminuria due to type 2 diabetes mellitus ([place]) 07/16/2020  . Allergic rhinitis   . T2DM (type 2 diabetes mellitus) ([place]) 03/06/2019  . Insomnia 06/20/2018   Past Medical History:  Diagnosis Date  . Allergic rhinitis   . Cataract   . Diabetes mellitus without complication ([place])   . Insomnia   . MRSA (methicillin resistant Staphylococcus aureus) 2000   right elbow  . Thrombocytopenia ([place])   . Thrombocytopenia, idiopathic (HCC) 12/20/2018   No Known Allergies  Medications: Outpatient Medications Prior to Visit  Medication Sig  . clobetasol (TEMOVATE) 0.05 % external solution   . EQ FIBER SUPPLEMENT PO Take by mouth.  Marland Kitchen FREESTYLE TEST STRIPS test strip TEST TID UTD  . Lancets (FREESTYLE) lancets USE TID  . metFORMIN (GLUCOPHAGE-XR) 500 MG 24 hr tablet Take 500 mg by mouth 2 (two) times daily.  . Multiple Vitamin (MULTIVITAMIN) tablet Take 1 tablet by mouth daily.  Marland Kitchen olopatadine (PATANOL) 0.1 % ophthalmic solution  INSTILL 1 DROP INTO BOTH EYES TWICE A DAY  . Semaglutide, 1 MG/DOSE, (OZEMPIC, 1 MG/DOSE,) 2 MG/1.5ML SOPN Inject 1 mg into the skin once a week.  . [DISCONTINUED] zolpidem (AMBIEN) 5 MG tablet Take 1 tablet (5 mg total) by mouth at bedtime as needed. Do not fill <30 days from last refill  . Loratadine 10 MG CAPS Take 10 mg by mouth daily as needed. (Patient not taking: Reported on 01/28/2021)   No facility-administered medications prior to visit.    Review of Systems  Constitutional: Negative for chills, fatigue and fever.  HENT: Negative for ear pain, nosebleeds, sinus pressure, sinus pain and sore throat.   Eyes: Negative for pain.  Respiratory: Negative for cough, chest tightness, shortness of breath and wheezing.   Cardiovascular: Negative for chest pain, palpitations and leg swelling.  Gastrointestinal: Positive for constipation. Negative for abdominal pain, blood in stool, diarrhea, rectal pain and vomiting.  Genitourinary: Negative for dysuria, flank pain, frequency, pelvic pain and urgency.  Musculoskeletal: Negative for back pain, neck pain and neck stiffness.  Neurological: Positive for dizziness. Negative for seizures, syncope, weakness, light-headedness and headaches.       Objective    BP (!) 158/80   Pulse 75   Temp 97.7 F (36.5 C) (Oral)   Resp 15   Wt 173 lb 4.8 oz (78.6 kg)   SpO2 100%   BMI 26.74 kg/m     Physical Exam Vitals reviewed.  Constitutional:      General: She is not in acute distress.    Appearance: Normal appearance. She is well-developed. She is not diaphoretic.  HENT:     Head: Normocephalic and atraumatic.  Eyes:     General: No scleral icterus.    Conjunctiva/sclera: Conjunctivae normal.  Neck:     Thyroid: No thyromegaly.  Cardiovascular:     Rate and Rhythm: Normal rate and regular rhythm.     Pulses: Normal pulses.     Heart sounds: Normal heart sounds. No murmur heard.   Pulmonary:     Effort: Pulmonary effort is normal. No  respiratory distress.     Breath sounds: Normal breath sounds. No wheezing, rhonchi or rales.  Musculoskeletal:     Cervical back: Neck supple.     Right lower leg: No edema.     Left lower leg: No edema.  Lymphadenopathy:     Cervical: No cervical adenopathy.  Skin:    General: Skin is warm and dry.     Findings: No rash.  Neurological:     Mental Status: She is alert and oriented to person, place, and time. Mental status is at baseline.  Psychiatric:        Mood and Affect: Mood normal.        Behavior: Behavior normal.       No results found for any visits on 01/28/21.  Assessment & Plan     Problem List Items Addressed This Visit      Endocrine   T2DM (type 2  diabetes mellitus) ([place]) - Primary    Previously well controlled Recheck a1c Continue current medications UTD on vaccines, foot exam Needs eye exam Discussed diet and exercise F/u in 3-6 months       Relevant Medications   metFORMIN (GLUCOPHAGE-XR) 500 MG 24 hr tablet   Other Relevant Orders   Hemoglobin A1c   Comprehensive metabolic panel   Lipid panel     Other   Insomnia    longstanding on ambien for many years Have discussed possible risks associated with Ambien in the elderly Patient wishes to continue - will not dose escalate Refill given for 12m supply      Overweight    Discussed importance of healthy weight management Discussed diet and exercise       Relevant Orders   Comprehensive metabolic panel   Lipid panel     Monitor home BPs  Return in about 3 months (around 04/30/2021) for chronic disease f/u.       I,Essence Turner,acting as a Education administrator for Lavon Paganini, MD.,have documented all relevant documentation on the behalf of Lavon Paganini, MD,as directed by  Lavon Paganini, MD while in the presence of Lavon Paganini, MD.  I, Lavon Paganini, MD, have reviewed all documentation for this visit. The documentation on 01/28/21 for the exam, diagnosis, procedures, and  orders are all accurate and complete.   Toni Hoffmeister, Dionne Bucy, MD, MPH [facility]

## 2021-01-28 NOTE — Assessment & Plan Note (Signed)
Previously well controlled Recheck a1c Continue current medications UTD on vaccines, foot exam Needs eye exam Discussed diet and exercise F/u in 3-6 months

## 2021-01-28 NOTE — Assessment & Plan Note (Signed)
longstanding on ambien for many years Have discussed possible risks associated with Ambien in the elderly Patient wishes to continue - will not dose escalate Refill given for 52m supply

## 2021-01-29 LAB — COMPREHENSIVE METABOLIC PANEL
ALT: 19 IU/L (ref 0–32)
AST: 21 IU/L (ref 0–40)
Albumin/Globulin Ratio: 1.8 (ref 1.2–2.2)
Albumin: 4.1 g/dL (ref 3.6–4.6)
Alkaline Phosphatase: 66 IU/L (ref 44–121)
BUN/Creatinine Ratio: 21 (ref 12–28)
BUN: 16 mg/dL (ref 8–27)
Bilirubin Total: 0.6 mg/dL (ref 0.0–1.2)
CO2: 22 mmol/L (ref 20–29)
Calcium: 9.7 mg/dL (ref 8.7–10.3)
Chloride: 102 mmol/L (ref 96–106)
Creatinine, Ser: 0.75 mg/dL (ref 0.57–1.00)
Globulin, Total: 2.3 g/dL (ref 1.5–4.5)
Glucose: 160 mg/dL — ABNORMAL HIGH (ref 65–99)
Potassium: 4.2 mmol/L (ref 3.5–5.2)
Sodium: 142 mmol/L (ref 134–144)
Total Protein: 6.4 g/dL (ref 6.0–8.5)
eGFR: 79 mL/min/{1.73_m2} (ref >59)

## 2021-01-29 LAB — HEMOGLOBIN A1C
Est. average glucose Bld gHb Est-mCnc: 166 mg/dL
Hgb A1c MFr Bld: 7.4 % — ABNORMAL HIGH (ref 4.8–5.6)

## 2021-01-29 LAB — LIPID PANEL
Chol/HDL Ratio: 3.1 ratio (ref 0.0–4.4)
Cholesterol, Total: 178 mg/dL (ref 100–199)
HDL: 57 mg/dL (ref >39)
LDL Chol Calc (NIH): 82 mg/dL (ref 0–99)
Triglycerides: 237 mg/dL — ABNORMAL HIGH (ref 0–149)
VLDL Cholesterol Cal: 39 mg/dL (ref 5–40)

## 2021-02-19 ENCOUNTER — Ambulatory Visit: Payer: Medicare Other | Admitting: Dermatology

## 2021-02-24 DIAGNOSIS — Z006 Encounter for examination for normal comparison and control in clinical research program: Secondary | ICD-10-CM | POA: Insufficient documentation

## 2021-03-05 ENCOUNTER — Telehealth: Payer: Self-pay

## 2021-03-05 NOTE — Telephone Encounter (Signed)
Patient was advised that we do not prescribe antibiotics without seeing patient.  Advised that she be seen by someone.  No appointments available, recommend [facility].  Patient said thank you.

## 2021-03-05 NOTE — Telephone Encounter (Signed)
Patient presented at front office requesting antibiotics for a vaginal boil.  [facility] office staff explained policy on prescribing antibiotics without being seen.  Patient told them that she gets these types of boils frequently and the only thing that ever helps in Doxycycline. She went on the say that we should be able to tell by her previous medical records that were obtained, how often she gets these.  Again, it was explained that she will most likely need to be seen.  She requested we ask anyway. I did look through her past visits here, she is diabetic, last A1C 1 mo ago was 7.4.  I do not see in any of your notes any mention of this history and there is not anything on her problem list or in her past history. She reports using hot compress for the past 2 days but not helping.   Uses [facility] [address] at [place] is [phone]

## 2021-03-06 NOTE — Telephone Encounter (Signed)
Noted  

## 2021-03-12 LAB — HM DIABETES EYE EXAM

## 2021-03-13 ENCOUNTER — Ambulatory Visit (INDEPENDENT_AMBULATORY_CARE_PROVIDER_SITE_OTHER): Payer: Medicare Other | Admitting: Dermatology

## 2021-03-13 ENCOUNTER — Other Ambulatory Visit: Payer: Self-pay

## 2021-03-13 DIAGNOSIS — D1800 Hemangioma unspecified site: Secondary | ICD-10-CM

## 2021-03-13 DIAGNOSIS — L732 Hidradenitis suppurativa: Secondary | ICD-10-CM

## 2021-03-13 DIAGNOSIS — L570 Actinic keratosis: Secondary | ICD-10-CM | POA: Diagnosis not present

## 2021-03-13 DIAGNOSIS — L578 Other skin changes due to chronic exposure to nonionizing radiation: Secondary | ICD-10-CM

## 2021-03-13 DIAGNOSIS — C439 Malignant melanoma of skin, unspecified: Secondary | ICD-10-CM

## 2021-03-13 DIAGNOSIS — D229 Melanocytic nevi, unspecified: Secondary | ICD-10-CM

## 2021-03-13 DIAGNOSIS — Z1283 Encounter for screening for malignant neoplasm of skin: Secondary | ICD-10-CM | POA: Diagnosis not present

## 2021-03-13 DIAGNOSIS — L814 Other melanin hyperpigmentation: Secondary | ICD-10-CM

## 2021-03-13 DIAGNOSIS — Z85820 Personal history of malignant melanoma of skin: Secondary | ICD-10-CM | POA: Diagnosis not present

## 2021-03-13 DIAGNOSIS — L821 Other seborrheic keratosis: Secondary | ICD-10-CM

## 2021-03-13 MED ORDER — DOXYCYCLINE MONOHYDRATE 100 MG PO CAPS: 100.0000 mg | ORAL_CAPSULE | Freq: Two times a day (BID) | ORAL | 0 refills | Status: AC

## 2021-03-13 NOTE — Progress Notes (Signed)
Follow-Up Visit   Subjective  Raven Lewis is a 86 y.o. female who presents for the following: FBSE (Patient here for full body skin exam and skin cancer screening. Patient with hx of MM. She is being treated by [facility] with Dr. Leafy Ro and was treated with T-Vec which did not work. Patient will start IV treatment tomorrow at [facility].).  Patient also has a hx of boils and has one that burst last week in the groin area.  The following portions of the chart were reviewed this encounter and updated as appropriate:   Tobacco  Allergies  Meds  Problems  Med Hx  Surg Hx  Fam Hx      Review of Systems:  No other skin or systemic complaints except as noted in HPI or Assessment and Plan.  Objective  Well appearing patient in no apparent distress; mood and affect are within normal limits.  A full examination was performed including scalp, head, eyes, ears, nose, lips, neck, chest, axillae, abdomen, back, buttocks, bilateral upper extremities, bilateral lower extremities, hands, feet, fingers, toes, fingernails, and toenails. All findings within normal limits unless otherwise noted below.  Scalp Black papules at scalp. Right cervical lymphadenopathy  Left Medial Thigh Draining inflammatory nodule at left inguinal fold, multiple scars and double open comedones bilateral  Left Thigh - Posterior Erythematous thin papules/macules with gritty scale.    Assessment & Plan  Melanoma of skin ([place]) Scalp  Continue treatment at [facility]. Patient seeing [facility] tomorrow to start therapy  Hidradenitis suppurativa Left Medial Thigh  Chronic condition with duration over one year. Condition is bothersome to patient. Currently flared.  Start doxycycline monohydrate 100mg  twice daily with food for 1 week.  Doxycycline should be taken with food to prevent nausea. Do not lay down for 30 minutes after taking. Be cautious with sun exposure and use good sun protection while on this medication.  Pregnant women should not take this medication.    She advises this has been quiescent for many years and only recently she had a flare.   If continues to flare again will consider topical clindamycin or low dose doxycycline.  Hidradenitis Suppurativa is a chronic; persistent; non-curable, but treatable condition due to abnormal inflamed sweat glands in the body folds (axilla, inframammary, groin, medial thighs), causing recurrent painful cysts and scarring. It can be associated with severe scarring acne and cysts; abscesses and scarring of scalp. The goal is control and prevention of flares, as it is not curable. Scars are permanent and can be thickened. Treatment may include daily use of topical medication and oral antibiotics.  Oral isotretinoin may also be helpful.  For more severe cases, Humira (a biologic injection) may be prescribed to decrease the inflammatory process and prevent flares.  When indicated, inflamed cysts may also be treated surgically.   doxycycline (MONODOX) 100 MG capsule - Left Medial Thigh Take 1 capsule (100 mg total) by mouth 2 (two) times daily for 7 days. Take with food  Related Procedures Anaerobic/Aerobic/Gram Stain  AK (actinic keratosis) Left Thigh - Posterior  Hypertrophic  Prior to procedure, discussed risks of blister formation, small wound, skin dyspigmentation, or rare scar following cryotherapy. Recommend Vaseline ointment to treated areas while healing.  Actinic keratoses are precancerous spots that appear secondary to cumulative UV radiation exposure/sun exposure over time. They are chronic with expected duration over 1 year. A portion of actinic keratoses will progress to squamous cell carcinoma of the skin. It is not possible to reliably predict which  spots will progress to skin cancer and so treatment is recommended to prevent development of skin cancer.  Recommend daily broad spectrum sunscreen SPF 30+ to sun-exposed areas, reapply every 2 hours  as needed.  Recommend staying in the shade or wearing long sleeves, sun glasses (UVA+UVB protection) and wide brim hats (4-inch brim around the entire circumference of the hat). Call for new or changing lesions.    Destruction of lesion - Left Thigh - Posterior  Destruction method: cryotherapy   Informed consent: discussed and consent obtained   Lesion destroyed using liquid nitrogen: Yes   Cryotherapy cycles:  2 Outcome: patient tolerated procedure well with no complications   Post-procedure details: wound care instructions given    Lentigines - Scattered tan macules - Due to sun exposure - Benign-appering, observe - Recommend daily broad spectrum sunscreen SPF 30+ to sun-exposed areas, reapply every 2 hours as needed. - Call for any changes  Seborrheic Keratoses - Stuck-on, waxy, tan-brown papules and/or plaques  - Benign-appearing - Discussed benign etiology and prognosis. - Observe - Call for any changes  Melanocytic Nevi - Tan-brown and/or pink-flesh-colored symmetric macules and papules - Benign appearing on exam today - Observation - Call clinic for new or changing moles - Recommend daily use of broad spectrum spf 30+ sunscreen to sun-exposed areas.   Hemangiomas - Red papules - Discussed benign nature - Observe - Call for any changes  Actinic Damage - Chronic condition, secondary to cumulative UV/sun exposure - diffuse scaly erythematous macules with underlying dyspigmentation - Recommend daily broad spectrum sunscreen SPF 30+ to sun-exposed areas, reapply every 2 hours as needed.  - Staying in the shade or wearing long sleeves, sun glasses (UVA+UVB protection) and wide brim hats (4-inch brim around the entire circumference of the hat) are also recommended for sun protection.  - Call for new or changing lesions.  Skin cancer screening performed today.  Return in about 3 months (around 06/13/2021) for TBSE.  Graciella Belton, RMA, am acting as scribe for  Forest Gleason, MD .  Documentation: I have reviewed the above documentation for accuracy and completeness, and I agree with the above.  Forest Gleason, MD

## 2021-03-13 NOTE — Patient Instructions (Addendum)
Start doxycycline monohydrate 100mg  twice daily with food for 1 week.  Doxycycline should be taken with food to prevent nausea. Do not lay down for 30 minutes after taking. Be cautious with sun exposure and use good sun protection while on this medication. Pregnant women should not take this medication.    Cryotherapy Aftercare  Wash gently with soap and water everyday.   Apply Vaseline and Band-Aid daily until healed.   Prior to procedure, discussed risks of blister formation, small wound, skin dyspigmentation, or rare scar following cryotherapy. Recommend Vaseline ointment to treated areas while healing.   Melanoma ABCDEs  Melanoma is the most dangerous type of skin cancer, and is the leading cause of death from skin disease.  You are more likely to develop melanoma if you: Have light-colored skin, light-colored eyes, or red or blond hair Spend a lot of time in the sun Tan regularly, either outdoors or in a tanning bed Have had blistering sunburns, especially during childhood Have a close family member who has had a melanoma Have atypical moles or large birthmarks  Early detection of melanoma is key since treatment is typically straightforward and cure rates are extremely high if we catch it early.   The first sign of melanoma is often a change in a mole or a new dark spot.  The ABCDE system is a way of remembering the signs of melanoma.  A for asymmetry:  The two halves do not match. B for border:  The edges of the growth are irregular. C for color:  A mixture of colors are present instead of an even brown color. D for diameter:  Melanomas are usually (but not always) greater than 45mm - the size of a pencil eraser. E for evolution:  The spot keeps changing in size, shape, and color.  Please check your skin once per month between visits. You can use a small mirror in front and a large mirror behind you to keep an eye on the back side or your body.   If you see any new or changing  lesions before your next follow-up, please call to schedule a visit.  Please continue daily skin protection including broad spectrum sunscreen SPF 30+ to sun-exposed areas, reapplying every 2 hours as needed when you're outdoors.    If you have any questions or concerns for your doctor, please call our main line at [phone] and press option 4 to reach your doctor's medical assistant. If no one answers, please leave a voicemail as directed and we will return your call as soon as possible. Messages left after 4 pm will be answered the following business day.   You may also send [place] a message via [place]. We typically respond to MyChart messages within 1-2 business days.  For prescription refills, please ask your pharmacy to contact our office. Our fax number is [phone].  If you have an urgent issue when the clinic is closed that cannot wait until the next business day, you can page your doctor at the number below.    Please note that while we do our best to be available for urgent issues outside of office hours, we are not available 24/7.   If you have an urgent issue and are unable to reach [place], you may choose to seek medical care at your doctor's office, retail clinic, urgent care center, or emergency room.  If you have a medical emergency, please immediately call 911 or go to the emergency department.  Pager Numbers  -  Dr. Nehemiah Massed: [phone]  - Dr. Laurence Ferrari: [phone]  - Dr. Nicole Kindred: [phone]  In the event of inclement weather, please call our main line at [phone] for an update on the status of any delays or closures.  Dermatology Medication Tips: Please keep the boxes that topical medications come in in order to help keep track of the instructions about where and how to use these. Pharmacies typically print the medication instructions only on the boxes and not directly on the medication tubes.   If your medication is too expensive, please contact our office at  [phone] option 4 or send [place] a message through [place].   We are unable to tell what your co-pay for medications will be in advance as this is different depending on your insurance coverage. However, we may be able to find a substitute medication at lower cost or fill out paperwork to get insurance to cover a needed medication.   If a prior authorization is required to get your medication covered by your insurance company, please allow [place] 1-2 business days to complete this process.  Drug prices often vary depending on where the prescription is filled and some pharmacies may offer cheaper prices.  The website www.goodrx.com contains coupons for medications through different pharmacies. The prices here do not account for what the cost may be with help from insurance (it may be cheaper with your insurance), but the website can give you the price if you did not use any insurance.  - You can print the associated coupon and take it with your prescription to the pharmacy.  - You may also stop by our office during regular business hours and pick up a GoodRx coupon card.  - If you need your prescription sent electronically to a different pharmacy, notify our office through [facility] or by phone at [phone] option 4.  Recommend taking Heliocare sun protection supplement daily in sunny weather for additional sun protection. For maximum protection on the sunniest days, you can take up to 2 capsules of regular Heliocare OR take 1 capsule of Heliocare Ultra. For prolonged exposure (such as a full day in the sun), you can repeat your dose of the supplement 4 hours after your first dose. Heliocare can be purchased at [facility] or at [email or web address].    Recommend daily broad spectrum sunscreen SPF 30+ to sun-exposed areas, reapply every 2 hours as needed. Call for new or changing lesions.  Staying in the shade or wearing long sleeves, sun glasses (UVA+UVB protection) and wide brim hats  (4-inch brim around the entire circumference of the hat) are also recommended for sun protection.

## 2021-03-19 ENCOUNTER — Telehealth: Payer: Self-pay

## 2021-03-19 LAB — ANAEROBIC/AEROBIC/GRAM STAIN

## 2021-03-19 NOTE — Telephone Encounter (Signed)
-----   Message from Alfonso Patten, MD sent at 03/19/2021 10:07 AM EDT ----- Culture from abscess grew only normal skin bacteria. If she is doing well, just finish the doxycycline and monitor. If she is having any trouble, let Dr. Jerilynn Mages know.  MAs please call. Thank you!

## 2021-03-19 NOTE — Telephone Encounter (Signed)
Called and advised patient of results and recommendations. Patient states she is doing much better and denied further questions.

## 2021-03-27 ENCOUNTER — Encounter: Payer: Self-pay | Admitting: Dermatology

## 2021-04-21 ENCOUNTER — Other Ambulatory Visit: Payer: Self-pay

## 2021-04-21 ENCOUNTER — Telehealth: Payer: Self-pay

## 2021-04-21 DIAGNOSIS — L732 Hidradenitis suppurativa: Secondary | ICD-10-CM

## 2021-04-21 MED ORDER — DOXYCYCLINE MONOHYDRATE 100 MG PO CAPS: 100.0000 mg | ORAL_CAPSULE | Freq: Two times a day (BID) | ORAL | 0 refills | Status: DC

## 2021-04-21 NOTE — Telephone Encounter (Signed)
Thank you :)

## 2021-04-21 NOTE — Progress Notes (Signed)
.  h 

## 2021-04-21 NOTE — Telephone Encounter (Addendum)
Patient called and has another painful boil at right inner thigh. She would like a refill for doxycycline '100mg'$  twice daily for 1 week.Cherly Hensen  Spoke with Dr. Laurence Ferrari and she approved refill per above. Sent in to [facility] and patient advised.Cherly Hensen

## 2021-04-24 ENCOUNTER — Other Ambulatory Visit: Payer: Self-pay

## 2021-04-24 ENCOUNTER — Ambulatory Visit (INDEPENDENT_AMBULATORY_CARE_PROVIDER_SITE_OTHER): Payer: Medicare Other | Admitting: Dermatology

## 2021-04-24 DIAGNOSIS — L723 Sebaceous cyst: Secondary | ICD-10-CM

## 2021-04-24 DIAGNOSIS — L0291 Cutaneous abscess, unspecified: Secondary | ICD-10-CM

## 2021-04-24 DIAGNOSIS — L720 Epidermal cyst: Secondary | ICD-10-CM

## 2021-04-24 MED ORDER — DOXYCYCLINE MONOHYDRATE 100 MG PO CAPS: 100.0000 mg | ORAL_CAPSULE | Freq: Two times a day (BID) | ORAL | 1 refills | Status: AC

## 2021-04-24 NOTE — Patient Instructions (Signed)
If you have any questions or concerns for your doctor, please call our main line at [phone] and press option 4 to reach your doctor's medical assistant. If no one answers, please leave a voicemail as directed and we will return your call as soon as possible. Messages left after 4 pm will be answered the following business day.   You may also send [place] a message via [place]. We typically respond to MyChart messages within 1-2 business days.  For prescription refills, please ask your pharmacy to contact our office. Our fax number is [phone].  If you have an urgent issue when the clinic is closed that cannot wait until the next business day, you can page your doctor at the number below.    Please note that while we do our best to be available for urgent issues outside of office hours, we are not available 24/7.   If you have an urgent issue and are unable to reach [place], you may choose to seek medical care at your doctor's office, retail clinic, urgent care center, or emergency room.  If you have a medical emergency, please immediately call 911 or go to the emergency department.  Pager Numbers  - Dr. Nehemiah Massed: [phone]  - Dr. Laurence Ferrari: [phone]  - Dr. Nicole Kindred: [phone]  In the event of inclement weather, please call our main line at [phone] for an update on the status of any delays or closures.  Dermatology Medication Tips: Please keep the boxes that topical medications come in in order to help keep track of the instructions about where and how to use these. Pharmacies typically print the medication instructions only on the boxes and not directly on the medication tubes.   If your medication is too expensive, please contact our office at [phone] option 4 or send [place] a message through [place].   We are unable to tell what your co-pay for medications will be in advance as this is different depending on your insurance coverage. However, we may be able to find a substitute  medication at lower cost or fill out paperwork to get insurance to cover a needed medication.   If a prior authorization is required to get your medication covered by your insurance company, please allow [place] 1-2 business days to complete this process.  Drug prices often vary depending on where the prescription is filled and some pharmacies may offer cheaper prices.  The website www.goodrx.com contains coupons for medications through different pharmacies. The prices here do not account for what the cost may be with help from insurance (it may be cheaper with your insurance), but the website can give you the price if you did not use any insurance.  - You can print the associated coupon and take it with your prescription to the pharmacy.  - You may also stop by our office during regular business hours and pick up a GoodRx coupon card.  - If you need your prescription sent electronically to a different pharmacy, notify our office through [facility] or by phone at [phone] option 4.  Continue Doxycycline '100mg'$  take one capsule twice dailly for 7 days. Doxycycline should be taken with food to prevent nausea. Do not lay down for 30 minutes after taking. Be cautious with sun exposure and use good sun protection while on this medication. Pregnant women should not take this medication.   Continue warm compresses daily.

## 2021-04-24 NOTE — Progress Notes (Signed)
   Follow-Up Visit   Subjective  Raven Lewis is a 86 y.o. female who presents for the following: boil (Of the right inner thigh - has been very painful and inflamed over the past couple of days. Patient has been using warm compresses and lesion did drain a lot of blood last night, then a plug of pus this morning. Patient started Doxycycyline '100mg'$  po BID a few days ago. ).  The following portions of the chart were reviewed this encounter and updated as appropriate:   Tobacco  Allergies  Meds  Problems  Med Hx  Surg Hx  Fam Hx     Review of Systems:  No other skin or systemic complaints except as noted in HPI or Assessment and Plan.  Objective  Well appearing patient in no apparent distress; mood and affect are within normal limits.  A focused examination was performed including the legs. Relevant physical exam findings are noted in the Assessment and Plan.   Assessment & Plan  Inflamed epidermoid cyst of skin Right Medial Thigh  Not consistent with HS flare -   Continue Doxycycline '100mg'$  po BID x 7 days. 1RF sent in if needed. Doxycycline should be taken with food to prevent nausea. Do not lay down for 30 minutes after taking. Be cautious with sun exposure and use good sun protection while on this medication. Pregnant women should not take this medication.    Incision and Drainage - Right Medial Thigh Location: R med thigh  Informed Consent: Discussed risks (permanent scarring, light or dark discoloration, infection, pain, bleeding, bruising, redness, damage to adjacent structures, and recurrence of the lesion) and benefits of the procedure, as well as the alternatives.  Informed consent was obtained.  Preparation: The area was prepped with Puracyn spray.  Anesthesia: Lidocaine 1% with epinephrine  Procedure Details: An incision was made overlying the lesion. The lesion drained chalky white material, pus and blood.  A large amount of fluid was drained.    Antibiotic  ointment and a sterile pressure dressing were applied. The patient tolerated procedure well.  Total number of lesions drained: 1  Plan: The patient was instructed on post-op care. Recommend OTC analgesia as needed for pain.   Anaerobic and Aerobic Culture - Right Medial Thigh  Related Medications doxycycline (MONODOX) 100 MG capsule Take 1 capsule (100 mg total) by mouth 2 (two) times daily for 7 days.  Return in about 3 weeks (around 05/15/2021) for surgery - cyst of the R med thigh.  Luther Redo, CMA, am acting as scribe for Forest Gleason, MD .  Documentation: I have reviewed the above documentation for accuracy and completeness, and I agree with the above.  Forest Gleason, MD

## 2021-04-25 ENCOUNTER — Encounter: Payer: Self-pay | Admitting: Dermatology

## 2021-05-01 ENCOUNTER — Encounter: Payer: Self-pay | Admitting: Family Medicine

## 2021-05-01 ENCOUNTER — Ambulatory Visit: Payer: Medicare Other | Admitting: Family Medicine

## 2021-05-01 ENCOUNTER — Ambulatory Visit (INDEPENDENT_AMBULATORY_CARE_PROVIDER_SITE_OTHER): Payer: Medicare Other | Admitting: Family Medicine

## 2021-05-01 VITALS — BP 142/75 | HR 88 | Temp 97.7°F | Resp 16 | Wt 170.1 lb

## 2021-05-01 DIAGNOSIS — G4700 Insomnia, unspecified: Secondary | ICD-10-CM

## 2021-05-01 DIAGNOSIS — R030 Elevated blood-pressure reading, without diagnosis of hypertension: Secondary | ICD-10-CM

## 2021-05-01 DIAGNOSIS — E1129 Type 2 diabetes mellitus with other diabetic kidney complication: Secondary | ICD-10-CM

## 2021-05-01 DIAGNOSIS — E663 Overweight: Secondary | ICD-10-CM | POA: Diagnosis not present

## 2021-05-01 DIAGNOSIS — C434 Malignant melanoma of scalp and neck: Secondary | ICD-10-CM | POA: Diagnosis not present

## 2021-05-01 DIAGNOSIS — R809 Proteinuria, unspecified: Secondary | ICD-10-CM

## 2021-05-01 NOTE — Assessment & Plan Note (Signed)
Reported lethargy on lisinopril and d/c'd Hold on losartan for now Continue to monitor

## 2021-05-01 NOTE — Assessment & Plan Note (Signed)
Longstanding On ambien for many years Have discussed possible risks associated with Ambien in the elderly Patient desires to continue - will not escalate dose will continue

## 2021-05-01 NOTE — Assessment & Plan Note (Signed)
Discussed importance of healthy weight management Discussed diet and exercise  

## 2021-05-01 NOTE — Assessment & Plan Note (Signed)
Managed at [facility] In a study

## 2021-05-01 NOTE — Assessment & Plan Note (Signed)
Previously well controlled (Goal A1c <7.5) Declines A1c today Continue current meds UTD vaccines and foot exam Discussed eye exam Discussed diet and exercise F/u in 55m

## 2021-05-01 NOTE — Progress Notes (Signed)
Established patient visit   Patient: Raven Lewis   DOB: 09-09-1935   86 y.o. Female  MRN: [id number] Visit Date: 05/01/2021  Today's healthcare provider: Lavon Paganini, MD   Chief Complaint  Patient presents with   Diabetes   Subjective  -------------------------------------------------------------------------------------------------------------------- Diabetes Pertinent negatives for hypoglycemia include no dizziness or headaches. Pertinent negatives for diabetes include no chest pain, no fatigue and no weakness.    Diabetes Mellitus Type II, Follow-up  Lab Results  Component Value Date   HGBA1C 7.4 (H) 01/28/2021   HGBA1C 6.8 (A) 10/29/2020   HGBA1C 6.6 (H) 07/16/2020   Wt Readings from Last 3 Encounters:  05/01/21 170 lb 1.6 oz (77.2 kg)  01/28/21 173 lb 4.8 oz (78.6 kg)  10/29/20 174 lb (78.9 kg)   Last seen for diabetes 3 months ago.  Management since then includes none.  Tolerating 500 mg metformin and 1 mg/dose ozempic.   She reports excellent compliance with treatment.  She is not having side effects.   Symptoms: Yes fatigue No foot ulcerations  No appetite changes No nausea  No paresthesia of the feet  No polydipsia  No polyuria No visual disturbances   No vomiting     Home blood sugar records: fasting range: 150-170  She doesn't monitor home blood pressures. However her [facility] charts shows blood pressure is within an acceptable range.   Episodes of hypoglycemia? No    Current insulin regiment: none Most Recent Eye Exam: >12 months Current exercise: no regular exercise Current diet habits: well balanced  Insomnia  She is taking 5 mg ambien to improve sleep. She has tried not to take medication but her insomnia isn't controlled.   Melanoma She is dealing with Melanoma treatment at [facility]. She has also had some boils and a cyst that is still draining. However she is well monitored and being treated.   Thrombocytopenia  She is  concerned about her platelet level being low and the possible affects on her health.  Vaccines She is eligible for the 2nd COVID booster and current on shingrix. She will check with her oncologist about possible barriers with receiving her booster.   Pertinent Labs: Lab Results  Component Value Date   CHOL 178 01/28/2021   HDL 57 01/28/2021   LDLCALC 82 01/28/2021   TRIG 237 (H) 01/28/2021   CHOLHDL 3.1 01/28/2021   Lab Results  Component Value Date   NA 142 01/28/2021   K 4.2 01/28/2021   CREATININE 0.75 01/28/2021   GFRNONAA 79 07/16/2020   GFRAA 91 07/16/2020   GLUCOSE 160 (H) 01/28/2021     ---------------------------------------------------------------------------------------------------      Medications: Outpatient Medications Prior to Visit  Medication Sig   clobetasol (TEMOVATE) 0.05 % external solution    doxycycline (MONODOX) 100 MG capsule Take 1 capsule (100 mg total) by mouth 2 (two) times daily for 7 days.   FREESTYLE TEST STRIPS test strip TEST TID UTD   Lancets (FREESTYLE) lancets USE TID   metFORMIN (GLUCOPHAGE-XR) 500 MG 24 hr tablet Take 500 mg by mouth 2 (two) times daily.   Multiple Vitamin (MULTIVITAMIN) tablet Take 1 tablet by mouth daily.   nivolumab 480 mg in sodium chloride 0.9 % 100 mL Inject 480 mg into the vein.   olopatadine (PATANOL) 0.1 % ophthalmic solution INSTILL 1 DROP INTO BOTH EYES TWICE A DAY   Semaglutide, 1 MG/DOSE, (OZEMPIC, 1 MG/DOSE,) 2 MG/1.5ML SOPN Inject 1 mg into the skin once a week.  zolpidem (AMBIEN) 5 MG tablet Take 1 tablet (5 mg total) by mouth at bedtime as needed. Do not fill <30 days from last refill   Loratadine 10 MG CAPS Take 10 mg by mouth daily as needed. (Patient not taking: Reported on 05/01/2021)   [DISCONTINUED] EQ FIBER SUPPLEMENT PO Take by mouth.   No facility-administered medications prior to visit.    Review of Systems  Constitutional:  Negative for chills, fatigue and fever.  HENT:  Negative for  ear pain, sinus pressure, sinus pain and sore throat.   Eyes:  Negative for pain and visual disturbance.  Respiratory:  Negative for cough, chest tightness, shortness of breath and wheezing.   Cardiovascular:  Negative for chest pain, palpitations and leg swelling.  Gastrointestinal:  Negative for abdominal pain, blood in stool, diarrhea, nausea and vomiting.  Genitourinary:  Negative for flank pain, frequency, pelvic pain and urgency.  Musculoskeletal:  Negative for back pain, myalgias and neck pain.  Neurological:  Negative for dizziness, weakness, light-headedness, numbness and headaches.      Objective  -------------------------------------------------------------------------------------------------------------------- BP (!) 142/75   Pulse 88   Temp 97.7 F (36.5 C) (Oral)   Resp 16   Wt 170 lb 1.6 oz (77.2 kg)   SpO2 98%   BMI 26.25 kg/m     Physical Exam Vitals reviewed.  Constitutional:      General: She is not in acute distress.    Appearance: Normal appearance. She is well-developed. She is not diaphoretic.  HENT:     Head: Normocephalic and atraumatic.  Eyes:     General: No scleral icterus.    Conjunctiva/sclera: Conjunctivae normal.  Neck:     Thyroid: No thyromegaly.  Cardiovascular:     Rate and Rhythm: Normal rate and regular rhythm.     Pulses: Normal pulses.     Heart sounds: Normal heart sounds. No murmur heard. Pulmonary:     Effort: Pulmonary effort is normal. No respiratory distress.     Breath sounds: Normal breath sounds. No wheezing, rhonchi or rales.  Musculoskeletal:     Cervical back: Neck supple.     Right lower leg: No edema.     Left lower leg: No edema.  Lymphadenopathy:     Cervical: No cervical adenopathy.  Skin:    General: Skin is warm and dry.     Findings: No rash.  Neurological:     Mental Status: She is alert and oriented to person, place, and time. Mental status is at baseline.  Psychiatric:        Mood and Affect: Mood  normal.        Behavior: Behavior normal.     No results found for any visits on 05/01/21.  Assessment & Plan  ---------------------------------------------------------------------------------------------------------------------- Problem List Items Addressed This Visit       Endocrine   T2DM (type 2 diabetes mellitus) ([place]) - Primary    Previously well controlled (Goal A1c <7.5) Declines A1c today Continue current meds UTD vaccines and foot exam Discussed eye exam Discussed diet and exercise F/u in 28m     Microalbuminuria due to type 2 diabetes mellitus [place]    Reported lethargy on lisinopril and d/c'd Hold on losartan for now Continue to monitor        Other   Insomnia    Longstanding On ambien for many years Have discussed possible risks associated with Ambien in the elderly Patient desires to continue - will not escalate dose will continue  Overweight    Discussed importance of healthy weight management Discussed diet and exercise       Melanoma ([place])    Managed at [facility] In a study      Other Visit Diagnoses     Elevated BP without diagnosis of hypertension         - will monitor - well controlled at last Oncology visit   Return in about 3 months (around 08/01/2021) for chronic disease f/u.      I,Essence Turner,acting as a Education administrator for Lavon Paganini, MD.,have documented all relevant documentation on the behalf of Lavon Paganini, MD,as directed by  Lavon Paganini, MD while in the presence of Lavon Paganini, MD.   I, Lavon Paganini, MD, have reviewed all documentation for this visit. The documentation on 05/01/21 for the exam, diagnosis, procedures, and orders are all accurate and complete.   Leightyn Cina, Dionne Bucy, MD, MPH [facility]

## 2021-05-02 LAB — ANAEROBIC AND AEROBIC CULTURE

## 2021-05-03 ENCOUNTER — Encounter: Payer: Self-pay | Admitting: Dermatology

## 2021-05-06 ENCOUNTER — Telehealth: Payer: Self-pay

## 2021-05-06 NOTE — Telephone Encounter (Signed)
Patient advised culture grew heavy growth normal skin bacteria, finish doxycycline, no further treatment needed. Patient wanted to cancel surgery appointment. She advises area has improved and she does chemo on Monday's and usually does not feel well the following day./js

## 2021-05-06 NOTE — Telephone Encounter (Signed)
-----   Message from Alfonso Patten, MD sent at 05/06/2021 10:25 AM EDT ----- Culture grew heavy growth normal skin bacteria. Recommend finishing course of doxycycline (should be almost done) and no additional treatment needed.  MAs please call. Thank you!

## 2021-05-08 ENCOUNTER — Encounter: Payer: Self-pay | Admitting: Dermatology

## 2021-05-08 NOTE — Telephone Encounter (Signed)
Please advise patient we do not need to cut out the cyst if she does not want to. The cyst may or may not flare up again. We can reschedule her for a recheck in clinic in another couple of weeks (can put during pump break, 8 am or 12:15 pm) if she would like, or if she is doing well and the area is not bothersome, we do not necessarily need to recheck. If it flares back up she can always call us (medical assistant line or send a medical question through [place]) and we can find an add on spot for her. Thank you!

## 2021-05-12 ENCOUNTER — Telehealth: Payer: Self-pay

## 2021-05-12 NOTE — Telephone Encounter (Signed)
Called patient and advised her we have cancelled her appointment for surgery to remove cyst per her request. Patient advises the cyst has improved and she will keep her follow up 06/19/21./js

## 2021-05-13 ENCOUNTER — Encounter: Payer: Medicare Other | Admitting: Dermatology

## 2021-06-10 ENCOUNTER — Encounter: Payer: Self-pay | Admitting: Dermatology

## 2021-06-11 ENCOUNTER — Other Ambulatory Visit: Payer: Self-pay

## 2021-06-11 ENCOUNTER — Ambulatory Visit (INDEPENDENT_AMBULATORY_CARE_PROVIDER_SITE_OTHER): Payer: Medicare Other | Admitting: Dermatology

## 2021-06-11 DIAGNOSIS — S71102A Unspecified open wound, left thigh, initial encounter: Secondary | ICD-10-CM | POA: Diagnosis not present

## 2021-06-11 DIAGNOSIS — T148XXA Other injury of unspecified body region, initial encounter: Secondary | ICD-10-CM

## 2021-06-11 DIAGNOSIS — L732 Hidradenitis suppurativa: Secondary | ICD-10-CM | POA: Diagnosis not present

## 2021-06-11 MED ORDER — CLINDAMYCIN PHOSPHATE 1 % EX LOTN
TOPICAL_LOTION | Freq: Every day | CUTANEOUS | 1 refills | Status: DC
Start: 2021-06-11 — End: 2021-11-27

## 2021-06-11 MED ORDER — MUPIROCIN 2 % EX OINT: 1.0000 "application " | TOPICAL_OINTMENT | Freq: Every day | CUTANEOUS | 0 refills | Status: DC

## 2021-06-11 NOTE — Patient Instructions (Signed)

## 2021-06-11 NOTE — Progress Notes (Signed)
   Follow-Up Visit   Subjective  Raven Lewis is a 86 y.o. female who presents for the following: Abscess (Patient with a draining lesion at left upper inner thigh. Area started draining last night. ).   The following portions of the chart were reviewed this encounter and updated as appropriate:   Tobacco  Allergies  Meds  Problems  Med Hx  Surg Hx  Fam Hx      Review of Systems:  No other skin or systemic complaints except as noted in HPI or Assessment and Plan.  Objective  Well appearing patient in no apparent distress; mood and affect are within normal limits.  A focused examination was performed including groin. Relevant physical exam findings are noted in the Assessment and Plan.  Left Medial Thigh Multiple scars and double open comedones, single draining open wound  Left Medial Thigh Open wound with drainage, no fluid collection   Assessment & Plan  Hidradenitis suppurativa Left Medial Thigh  Chronic condition with duration over one year. Condition is bothersome to patient. Currently flared.  Possibly flaring secondary to melanoma therapy  Start clindamcyin lotion daily to areas at groin.  Continue Hibaclens daily avoiding in vagina Patient will call for doxycycline (would start 20 mg bid with food) if flares despite clindamycin topical  clindamycin (CLEOCIN-T) 1 % lotion - Left Medial Thigh Apply topically daily.  Open wound Left Medial Thigh  HS > abscess Start mupirocin daily to area and cover with bandage.  mupirocin ointment (BACTROBAN) 2 % - Left Medial Thigh Apply 1 application topically daily. With dressing changes  Related Procedures Anaerobic and Aerobic Culture  Return in about 3 weeks (around 07/02/2021) for TBSE.  Graciella Belton, RMA, am acting as scribe for Forest Gleason, MD .  Documentation: I have reviewed the above documentation for accuracy and completeness, and I agree with the above.  Forest Gleason, MD

## 2021-06-12 ENCOUNTER — Encounter: Payer: Self-pay | Admitting: Dermatology

## 2021-06-19 ENCOUNTER — Ambulatory Visit: Payer: Medicare Other | Admitting: Dermatology

## 2021-06-22 LAB — ANAEROBIC AND AEROBIC CULTURE

## 2021-06-24 ENCOUNTER — Telehealth: Payer: Self-pay

## 2021-06-24 NOTE — Telephone Encounter (Signed)
-----   Message from Alfonso Patten, MD sent at 06/24/2021  1:57 PM EDT ----- Culture showed no bacterial growth, no additional treatment as long as area is healing well.  MAs please call. Thank you!

## 2021-06-24 NOTE — Telephone Encounter (Signed)
Error

## 2021-06-27 ENCOUNTER — Telehealth: Payer: Self-pay | Admitting: Family Medicine

## 2021-06-27 DIAGNOSIS — E1129 Type 2 diabetes mellitus with other diabetic kidney complication: Secondary | ICD-10-CM

## 2021-06-27 MED ORDER — BLOOD GLUCOSE METER KIT: PACK | 0 refills | Status: DC

## 2021-06-27 NOTE — Telephone Encounter (Signed)
Pt is calling to let Dr. B know that her glucometer broke. Pt is requesting a new script to be sent to [place], [place] [address] [phone]  For freestyle Freedom Lite

## 2021-07-03 ENCOUNTER — Encounter: Payer: Self-pay | Admitting: Dermatology

## 2021-07-03 ENCOUNTER — Other Ambulatory Visit: Payer: Self-pay

## 2021-07-03 ENCOUNTER — Ambulatory Visit (INDEPENDENT_AMBULATORY_CARE_PROVIDER_SITE_OTHER): Payer: Medicare Other | Admitting: Dermatology

## 2021-07-03 DIAGNOSIS — D2262 Melanocytic nevi of left upper limb, including shoulder: Secondary | ICD-10-CM

## 2021-07-03 DIAGNOSIS — L732 Hidradenitis suppurativa: Secondary | ICD-10-CM | POA: Diagnosis not present

## 2021-07-03 DIAGNOSIS — L578 Other skin changes due to chronic exposure to nonionizing radiation: Secondary | ICD-10-CM

## 2021-07-03 DIAGNOSIS — D1801 Hemangioma of skin and subcutaneous tissue: Secondary | ICD-10-CM

## 2021-07-03 DIAGNOSIS — L0291 Cutaneous abscess, unspecified: Secondary | ICD-10-CM | POA: Diagnosis not present

## 2021-07-03 DIAGNOSIS — Z1283 Encounter for screening for malignant neoplasm of skin: Secondary | ICD-10-CM

## 2021-07-03 DIAGNOSIS — C439 Malignant melanoma of skin, unspecified: Secondary | ICD-10-CM

## 2021-07-03 DIAGNOSIS — L814 Other melanin hyperpigmentation: Secondary | ICD-10-CM

## 2021-07-03 DIAGNOSIS — Z85820 Personal history of malignant melanoma of skin: Secondary | ICD-10-CM

## 2021-07-03 DIAGNOSIS — C434 Malignant melanoma of scalp and neck: Secondary | ICD-10-CM

## 2021-07-03 DIAGNOSIS — D229 Melanocytic nevi, unspecified: Secondary | ICD-10-CM

## 2021-07-03 DIAGNOSIS — L820 Inflamed seborrheic keratosis: Secondary | ICD-10-CM | POA: Diagnosis not present

## 2021-07-03 DIAGNOSIS — D485 Neoplasm of uncertain behavior of skin: Secondary | ICD-10-CM

## 2021-07-03 DIAGNOSIS — L821 Other seborrheic keratosis: Secondary | ICD-10-CM

## 2021-07-03 MED ORDER — DOXYCYCLINE MONOHYDRATE 100 MG PO CAPS
ORAL_CAPSULE | ORAL | 0 refills | Status: DC
Start: 2021-07-03 — End: 2021-07-21

## 2021-07-03 NOTE — Patient Instructions (Addendum)
Doxycycline should be taken with food to prevent nausea. Do not lay down for 30 minutes after taking. Be cautious with sun exposure and use good sun protection while on this medication. Pregnant women should not take this medication.   If you have any questions or concerns for your doctor, please call our main line at [phone] and press option 4 to reach your doctor's medical assistant. If no one answers, please leave a voicemail as directed and we will return your call as soon as possible. Messages left after 4 pm will be answered the following business day.   You may also send [place] a message via [place]. We typically respond to MyChart messages within 1-2 business days.  For prescription refills, please ask your pharmacy to contact our office. Our fax number is [phone].  If you have an urgent issue when the clinic is closed that cannot wait until the next business day, you can page your doctor at the number below.    Please note that while we do our best to be available for urgent issues outside of office hours, we are not available 24/7.   If you have an urgent issue and are unable to reach [place], you may choose to seek medical care at your doctor's office, retail clinic, urgent care center, or emergency room.  If you have a medical emergency, please immediately call 911 or go to the emergency department.  Pager Numbers  - Dr. Nehemiah Massed: [phone]  - Dr. Laurence Ferrari: [phone]  - Dr. Nicole Kindred: [phone]  In the event of inclement weather, please call our main line at [phone] for an update on the status of any delays or closures.  Dermatology Medication Tips: Please keep the boxes that topical medications come in in order to help keep track of the instructions about where and how to use these. Pharmacies typically print the medication instructions only on the boxes and not directly on the medication tubes.   If your medication is too expensive, please contact our office at  [phone] option 4 or send [place] a message through [place].   We are unable to tell what your co-pay for medications will be in advance as this is different depending on your insurance coverage. However, we may be able to find a substitute medication at lower cost or fill out paperwork to get insurance to cover a needed medication.   If a prior authorization is required to get your medication covered by your insurance company, please allow [place] 1-2 business days to complete this process.  Drug prices often vary depending on where the prescription is filled and some pharmacies may offer cheaper prices.  The website www.goodrx.com contains coupons for medications through different pharmacies. The prices here do not account for what the cost may be with help from insurance (it may be cheaper with your insurance), but the website can give you the price if you did not use any insurance.  - You can print the associated coupon and take it with your prescription to the pharmacy.  - You may also stop by our office during regular business hours and pick up a GoodRx coupon card.  - If you need your prescription sent electronically to a different pharmacy, notify our office through [facility] or by phone at [phone] option 4.   Wound Care Instructions  Cleanse wound gently with soap and water once a day then pat dry with clean gauze. Apply a thing coat of Petrolatum (petroleum jelly, "Vaseline") over the wound (unless  you have an allergy to this). We recommend that you use a new, sterile tube of Vaseline. Do not pick or remove scabs. Do not remove the yellow or white "healing tissue" from the base of the wound.  Cover the wound with fresh, clean, nonstick gauze and secure with paper tape. You may use Band-Aids in place of gauze and tape if the would is small enough, but would recommend trimming much of the tape off as there is often too much. Sometimes Band-Aids can irritate the skin.  You should  call the office for your biopsy report after 1 week if you have not already been contacted.  If you experience any problems, such as abnormal amounts of bleeding, swelling, significant bruising, significant pain, or evidence of infection, please call the office immediately.  FOR ADULT SURGERY PATIENTS: If you need something for pain relief you may take 1 extra strength Tylenol (acetaminophen) AND 2 Ibuprofen (200mg  each) together every 4 hours as needed for pain. (do not take these if you are allergic to them or if you have a reason you should not take them.) Typically, you may only need pain medication for 1 to 3 days.

## 2021-07-03 NOTE — Progress Notes (Signed)
Follow-Up Visit   Subjective  Raven Lewis is a 86 y.o. female who presents for the following: Annual Exam (Hx MM - patient has noticed a new lesion on the R forearm that she would like checked today. She is continuing treatment for MM in Nov 2022. ). The patient presents for Total-Body Skin Exam (TBSE) for skin cancer screening and mole check.  The following portions of the chart were reviewed this encounter and updated as appropriate:   Tobacco  Allergies  Meds  Problems  Med Hx  Surg Hx  Fam Hx      Review of Systems:  No other skin or systemic complaints except as noted in HPI or Assessment and Plan.  Objective  Well appearing patient in no apparent distress; mood and affect are within normal limits.  A full examination was performed including scalp, head, eyes, ears, nose, lips, neck, chest, axillae, abdomen, back, buttocks, bilateral upper extremities, bilateral lower extremities, hands, feet, fingers, toes, fingernails, and toenails. All findings within normal limits unless otherwise noted below.  L forearm 0.6 cm thin pink papule.     L upper arm 0.25 cm dark brown papule without features suspicious for malignancy on dermoscopy   Left Medial Thigh Many scars and double open comedones ing glu crease with draining cyst at L inguinal fold.    Assessment & Plan  Abscess  Neoplasm of uncertain behavior of skin L forearm  Skin / nail biopsy Type of biopsy: tangential   Informed consent: discussed and consent obtained   Timeout: patient name, date of birth, surgical site, and procedure verified   Procedure prep:  Patient was prepped and draped in usual sterile fashion Prep type:  Isopropyl alcohol Anesthesia: the lesion was anesthetized in a standard fashion   Anesthetic:  1% lidocaine w/ epinephrine 1-100,000 buffered w/ 8.4% NaHCO3 Instrument used: flexible razor blade   Hemostasis achieved with: pressure, aluminum chloride and electrodesiccation    Outcome: patient tolerated procedure well   Post-procedure details: sterile dressing applied and wound care instructions given   Dressing type: bandage and petrolatum    Specimen 1 - Surgical pathology Differential Diagnosis: D48.5 r/o SCC vs amelanotic melanoma  Check Margins: No    Nevus L upper arm  Benign-appearing.  Observation.  Call clinic for new or changing moles.  Recommend daily use of broad spectrum spf 30+ sunscreen to sun-exposed areas.   Hidradenitis suppurativa Left Medial Thigh  Chronic condition with duration or expected duration over one year. Condition is bothersome to patient. Currently flared.  Abscess related to HS and complicated by MM treatment.   Cultured to r/u superinfection today, though culture last visit showed no growth.  Start Doxycycline Monohydrate 100mg  po BID x 7 days. Doxycycline should be taken with food to prevent nausea. Do not lay down for 30 minutes after taking. Be cautious with sun exposure and use good sun protection while on this medication. Pregnant women should not take this medication.   HS may have become more active again related to her immunotherapy for melanoma.   Advised we may need to continue longer term therapy such as doxycycline 20 mg twice a day or may need to do a small surgery to address a persistent sinus tract there, but will defer longer term therapy at this time and see how she does since she says oncology prefers her not to be on any other medications while on the immunotherapy if possible  Continue clindamycin topical daily and hibiclens wash daily  Anaerobic and Aerobic Culture - Left Medial Thigh  doxycycline (MONODOX) 100 MG capsule - Left Medial Thigh Take one cap po BID x 7 days. Take with food.  Related Medications clindamycin (CLEOCIN-T) 1 % lotion Apply topically daily.  Malignant melanoma of skin ([place]) Scalp  Continue treatment plan with [facility] oncology.   Recommend total-body skin exams every  3 months for a year; then every 4 months for a year; then every 6 months for 3 years.  At 5 years post treatment, if all looks good we would recommend at least yearly total-body skin exams for the rest of your life. We recommend frequent self skin examinations; photoprotection with sunscreen, sun protective clothing, hats, sunglasses and sun avoidance.  If the patient notices any new or changing skin lesions the patient should return to the office immediately for evaluation.    Lentigines - Scattered tan macules - Due to sun exposure - Benign-appearing, observe - Recommend daily broad spectrum sunscreen SPF 30+ to sun-exposed areas, reapply every 2 hours as needed. - Call for any changes  Seborrheic Keratoses - Stuck-on, waxy, tan-brown papules and/or plaques  - Benign-appearing - Discussed benign etiology and prognosis. - Observe - Call for any changes  Melanocytic Nevi - Tan-brown and/or pink-flesh-colored symmetric macules and papules - Benign appearing on exam today - Observation - Call clinic for new or changing moles - Recommend daily use of broad spectrum spf 30+ sunscreen to sun-exposed areas.   Hemangiomas - Red papules - Discussed benign nature - Observe - Call for any changes  Actinic Damage - Chronic condition, secondary to cumulative UV/sun exposure - diffuse scaly erythematous macules with underlying dyspigmentation - Recommend daily broad spectrum sunscreen SPF 30+ to sun-exposed areas, reapply every 2 hours as needed.  - Staying in the shade or wearing long sleeves, sun glasses (UVA+UVB protection) and wide brim hats (4-inch brim around the entire circumference of the hat) are also recommended for sun protection.  - Call for new or changing lesions.  Skin cancer screening performed today.  Return in about 3 months (around 10/03/2021) for TBSE - hx MM.  Luther Redo, CMA, am acting as scribe for Forest Gleason, MD .  Documentation: I have reviewed the above  documentation for accuracy and completeness, and I agree with the above.  Forest Gleason, MD

## 2021-07-04 ENCOUNTER — Other Ambulatory Visit: Payer: Self-pay | Admitting: Family Medicine

## 2021-07-04 DIAGNOSIS — Z1231 Encounter for screening mammogram for malignant neoplasm of breast: Secondary | ICD-10-CM

## 2021-07-14 LAB — ANAEROBIC AND AEROBIC CULTURE

## 2021-07-15 ENCOUNTER — Telehealth: Payer: Self-pay

## 2021-07-15 NOTE — Telephone Encounter (Signed)
-----   Message from Alfonso Patten, MD sent at 07/15/2021 10:06 AM EDT ----- Culture showed no bacterial growth. Suspect repeat "abscesses" due to hidradenitis suppurativa activity. Recommend continuing clindamycin solution and chlorhexidine (hibiclens) wash daily. If this continues to be a problem, recommend starting doxycycline 20 mg twice a day with food to help keep hidradenitis calmed down.   MAs please call. Thank you!

## 2021-07-16 NOTE — Telephone Encounter (Signed)
-----   Message from Alfonso Patten, MD sent at 07/15/2021 10:06 AM EDT ----- Culture showed no bacterial growth. Suspect repeat "abscesses" due to hidradenitis suppurativa activity. Recommend continuing clindamycin solution and chlorhexidine (hibiclens) wash daily. If this continues to be a problem, recommend starting doxycycline 20 mg twice a day with food to help keep hidradenitis calmed down.   MAs please call. Thank you!

## 2021-07-16 NOTE — Telephone Encounter (Signed)
Advised patient of results and she is improving. Drainage has stopped and area seems to be drying up. Advised to keep follow up appointment 10/08/21/hd

## 2021-07-21 ENCOUNTER — Other Ambulatory Visit: Payer: Self-pay

## 2021-07-21 ENCOUNTER — Ambulatory Visit (INDEPENDENT_AMBULATORY_CARE_PROVIDER_SITE_OTHER): Payer: Medicare Other | Admitting: Medical

## 2021-07-21 ENCOUNTER — Encounter: Payer: Self-pay | Admitting: Medical

## 2021-07-21 VITALS — BP 150/94 | HR 76 | Ht 67.0 in | Wt 172.0 lb

## 2021-07-21 DIAGNOSIS — R002 Palpitations: Secondary | ICD-10-CM

## 2021-07-21 DIAGNOSIS — I10 Essential (primary) hypertension: Secondary | ICD-10-CM | POA: Diagnosis not present

## 2021-07-21 NOTE — Progress Notes (Signed)
Cardiology Office Note:    Date:  07/21/2021   ID:  Raven Lewis, DOB 30-Jun-1935, MRN [id number]  PCP:  Raven Crews, MD  [facility] Cardiologist:  Dr. Darl[facility] Electrophysiologist:  None   Referring MD: Raven Crews, MD   Chief Complaint: 6-12 month follow-up  History of Present Illness:    Raven Lewis is a 86 y.o. female with a hx of DM2, HTN, smoker who presents for follow-up.   She was last seen 08/2020 for palpitations and tachycardia. Heart monitor was ordered which showed NSR with avg heart rate of 78bpm, 5 beat run of wide-complex tachycardia, likely VT, 20 episodes. She was recommended to take metoprolol.   Today, the patient reports she has been doing well from a cardiac standpoint. Has been treated at [facility] for Melanoma, a chemo drug. No chest pain or shortness of breath, LLE, orthopnea, palpitations. BP elevated today. Patient says she will not take BP medication. At [place] often and says it's normally lower, around 130/80s. She walks every day around 20 minutes. She lives by herself and performs all ADLs. She reports she never started metoprolol.   Past Medical History:  Diagnosis Date   Allergic rhinitis    Cataract    Diabetes mellitus without complication (HCC)    Insomnia    MRSA (methicillin resistant Staphylococcus aureus) 2000   right elbow   Thrombocytopenia ([place])    Thrombocytopenia, idiopathic ([place]) 12/20/2018    Past Surgical History:  Procedure Laterality Date   CATARACT EXTRACTION Left    ELBOW SURGERY     MRSA in right elbow   TUBAL LIGATION     WRIST SURGERY     left wrist    Current Medications: Current Meds  Medication Sig   blood glucose meter kit and supplies Dispense based on patient and insurance preference. To check blood sugar once daily.   clindamycin (CLEOCIN-T) 1 % lotion Apply topically daily.   clobetasol (TEMOVATE) 0.05 % external solution    FREESTYLE TEST STRIPS test strip TEST TID UTD    Lancets (FREESTYLE) lancets USE TID   Loratadine 10 MG CAPS Take 10 mg by mouth daily as needed.   metFORMIN (GLUCOPHAGE-XR) 500 MG 24 hr tablet Take 500 mg by mouth 2 (two) times daily.   Multiple Vitamin (MULTIVITAMIN) tablet Take 1 tablet by mouth daily.   olopatadine (PATANOL) 0.1 % ophthalmic solution Place 1 drop into both eyes as needed.   Semaglutide, 1 MG/DOSE, (OZEMPIC, 1 MG/DOSE,) 2 MG/1.5ML SOPN Inject 1 mg into the skin once a week.   zolpidem (AMBIEN) 5 MG tablet Take 1 tablet (5 mg total) by mouth at bedtime as needed. Do not fill <30 days from last refill     Allergies:   Patient has no known allergies.   Social History   Socioeconomic History   Marital status: Widowed    Spouse name: Not on file   Number of children: 2   Years of education: Not on file   Highest education level: Not on file  Occupational History   Occupation: retired  Tobacco Use   Smoking status: Former    Packs/day: 1.50    Years: 40.00    Pack years: 60.00    Types: Cigarettes    Quit date: 11/29/2000    Years since quitting: 20.6   Smokeless tobacco: Never  Vaping Use   Vaping Use: Never used  Substance and Sexual Activity   Alcohol use: Yes    Alcohol/week: 1.0  standard drink    Types: 1 Glasses of wine per week    Comment: occasionally, less than weekly   Drug use: Never   Sexual activity: Not Currently  Other Topics Concern   Not on file  Social History Narrative   Not on file   Social Determinants of Health   Financial Resource Strain: Not on file  Food Insecurity: Not on file  Transportation Needs: Not on file  Physical Activity: Not on file  Stress: Not on file  Social Connections: Not on file     Family History: The patient's family history includes Breast cancer (age of onset: 65) in her mother; Heart disease in her sister and sister; Lung cancer in her brother. There is no history of Colon cancer.  ROS:   Please see the history of present illness.     All other  systems reviewed and are negative.  EKGs/Labs/Other Studies Reviewed:    The following studies were reviewed today:  Heart monitor 08/2020 14-day ZIO monitor:   Normal sinus rhythm with an average heart rate of 78 bpm. 5 beat run of wide-complex tachycardia with a rate of 150 bpm, likely ventricular tachycardia. 20 episodes of supraventricular tachycardia noted, the longest 12 beats.  No reported symptoms with these episodes. Triggered events did not correlate with arrhythmia.  EKG:  EKG is  ordered today.  The ekg ordered today demonstrates NSR, 76bpm, nonspecific T wave changes  Recent Labs: 01/28/2021: ALT 19; BUN 16; Creatinine, Ser 0.75; Potassium 4.2; Sodium 142  Recent Lipid Panel    Component Value Date/Time   CHOL 178 01/28/2021 1033   TRIG 237 (H) 01/28/2021 1033   HDL 57 01/28/2021 1033   CHOLHDL 3.1 01/28/2021 1033   LDLCALC 82 01/28/2021 1033    Physical Exam:    VS:  BP (!) 150/94 (BP Location: Left Arm, Patient Position: Sitting, Cuff Size: Normal)   Pulse 76   Ht _0  (1.702 m)   Wt 172 lb (78 kg)   SpO2 97%   BMI 26.94 kg/m     Wt Readings from Last 3 Encounters:  07/21/21 172 lb (78 kg)  05/01/21 170 lb 1.6 oz (77.2 kg)  01/28/21 173 lb 4.8 oz (78.6 kg)     GEN:  Well nourished, well developed in no acute distress HEENT: Normal NECK: No JVD; No carotid bruits LYMPHATICS: No lymphadenopathy CARDIAC: RRR, no murmurs, rubs, gallops RESPIRATORY:  Clear to auscultation without rales, wheezing or rhonchi  ABDOMEN: Soft, non-tender, non-distended MUSCULOSKELETAL:  No edema; No deformity  SKIN: Warm and dry NEUROLOGIC:  Alert and oriented x 3 PSYCHIATRIC:  Normal affect   ASSESSMENT:    1. Palpitations   2. Essential hypertension    PLAN:    In order of problems listed above:  Palpitations Prior heart monitor 08/2020 showed NSR, 5 beats WCT, 20 episodes of SVT. She was suggested to take Metoprolol, however this was never started. No further  episodes of palpitations. No further work-up.   HTN BP high today, but patient says it's normally lower. By review at [facility] 110-130/70-80s. She is not on antihypertensives and says she will not take them.   Disposition: Follow up in 1 year(s) with MD/APP    Signed, Tesslyn Baumert Ninfa Meeker, PA-C  07/21/2021 1:49 PM    [facility]

## 2021-07-21 NOTE — Patient Instructions (Signed)
Medication Instructions:  No changes at this time.  *If you need a refill on your cardiac medications before your next appointment, please call your pharmacy*   Lab Work: None  If you have labs (blood work) drawn today and your tests are completely normal, you will receive your results only by: [facility] [place] (if you have MyChart) OR A paper copy in the mail If you have any lab test that is abnormal or we need to change your treatment, we will call you to review the results.   Testing/Procedures: None   Follow-Up: At [facility], you and your health needs are our priority.  As part of our continuing mission to provide you with exceptional heart care, we have created designated Provider Care Teams.  These Care Teams include your primary Cardiologist (physician) and Advanced Practice Providers (APPs -  Physician Assistants and Nurse Practitioners) who all work together to provide you with the care you need, when you need it.  Your next appointment:   1 year(s)  The format for your next appointment:   In Person  Provider:   You may see Dr. Kathlyn Sacramento or one of the following Advanced Practice Providers on your designated Care Team:   Murray Hodgkins, NP Christell Faith, PA-C Cadence Kathlen Mody, [place]

## 2021-07-24 ENCOUNTER — Other Ambulatory Visit: Payer: Self-pay

## 2021-07-24 ENCOUNTER — Ambulatory Visit
Admission: RE | Admit: 2021-07-24 | Discharge: 2021-07-24 | Disposition: A | Discharge status: Home / Self Care | Payer: Medicare Other | Source: Ambulatory Visit | Attending: Family Medicine | Admitting: Family Medicine

## 2021-07-24 DIAGNOSIS — Z1231 Encounter for screening mammogram for malignant neoplasm of breast: Secondary | ICD-10-CM | POA: Diagnosis present

## 2021-07-28 ENCOUNTER — Other Ambulatory Visit: Payer: Self-pay | Admitting: Family Medicine

## 2021-07-28 NOTE — Telephone Encounter (Signed)
Requested Prescriptions  Pending Prescriptions Disp Refills  . OZEMPIC, 1 MG/DOSE, 4 MG/3ML SOPN [Pharmacy Med Name: OZEMPIC PEN 1MG ] 9 mL 0    Sig: INJECT 1MG  SUBCUTANEOUSLY  ONCE A WEEK     Endocrinology:  Diabetes - GLP-1 Receptor Agonists Failed - 07/28/2021  8:46 AM      Failed - HBA1C is between 0 and 7.9 and within 180 days    Hemoglobin A1C  Date Value Ref Range Status  02/06/2020 6.8  Final   Hgb A1c MFr Bld  Date Value Ref Range Status  01/28/2021 7.4 (H) 4.8 - 5.6 % Final    Comment:             Prediabetes: 5.7 - 6.4          Diabetes: >6.4          Glycemic control for adults with diabetes: <7.0          Passed - Valid encounter within last 6 months    Recent Outpatient Visits          2 months ago Type 2 diabetes mellitus with microalbuminuria, without long-term current use of insulin ([place])   [facility] [place], Dionne Bucy, MD   6 months ago Type 2 diabetes mellitus with microalbuminuria, without long-term current use of insulin [facility])   [facility], Dionne Bucy, MD   9 months ago Type 2 diabetes mellitus with microalbuminuria, without long-term current use of insulin [facility])   [facility] [place], Dionne Bucy, MD   10 months ago Type 2 diabetes mellitus with microalbuminuria, without long-term current use of insulin [facility])   [facility] [place], Dionne Bucy, MD   11 months ago Functional diarrhea   [facility] [place], Dionne Bucy, MD      Future Appointments            In 2 weeks [place], Dionne Bucy, MD [facility], [place]   In 2 months [facility], [place], [place]

## 2021-08-14 DIAGNOSIS — Z5111 Encounter for antineoplastic chemotherapy: Secondary | ICD-10-CM | POA: Insufficient documentation

## 2021-08-15 ENCOUNTER — Ambulatory Visit (INDEPENDENT_AMBULATORY_CARE_PROVIDER_SITE_OTHER): Payer: Medicare Other | Admitting: Family Medicine

## 2021-08-15 ENCOUNTER — Encounter: Payer: Self-pay | Admitting: Family Medicine

## 2021-08-15 ENCOUNTER — Other Ambulatory Visit: Payer: Self-pay

## 2021-08-15 VITALS — BP 134/83 | HR 73 | Temp 97.5°F | Resp 16 | Wt 168.3 lb

## 2021-08-15 DIAGNOSIS — E1129 Type 2 diabetes mellitus with other diabetic kidney complication: Secondary | ICD-10-CM

## 2021-08-15 DIAGNOSIS — G4700 Insomnia, unspecified: Secondary | ICD-10-CM | POA: Diagnosis not present

## 2021-08-15 DIAGNOSIS — R809 Proteinuria, unspecified: Secondary | ICD-10-CM | POA: Diagnosis not present

## 2021-08-15 LAB — POCT GLYCOSYLATED HEMOGLOBIN (HGB A1C)
Est. average glucose Bld gHb Est-mCnc: 148
Hemoglobin A1C: 6.8 % — AB (ref 4.0–5.6)

## 2021-08-15 MED ORDER — ZOLPIDEM TARTRATE 5 MG PO TABS: 5.0000 mg | ORAL_TABLET | Freq: Every evening | ORAL | 5 refills | Status: DC | PRN

## 2021-08-15 NOTE — Assessment & Plan Note (Signed)
Reported lethargy on lisinopril - was d/c'd Hold on losartan for now Continue to monitor Urine micro/Cr ratio

## 2021-08-15 NOTE — Progress Notes (Signed)
Established patient visit   Patient: Raven Lewis   DOB: Jul 24, 1935   86 y.o. Female  MRN: [id number] Visit Date: 08/15/2021  Today's healthcare provider: Lavon Paganini, MD   Chief Complaint  Patient presents with   Follow-up    DM    Subjective    HPI HPI     Follow-up    Additional comments: DM      Last edited by Doristine Devoid, CMA on 08/15/2021 11:38 AM.      Diabetes Mellitus Type II, Follow-up  Lab Results  Component Value Date   HGBA1C 6.8 (A) 08/15/2021   HGBA1C 7.4 (H) 01/28/2021   HGBA1C 6.8 (A) 10/29/2020   Wt Readings from Last 3 Encounters:  08/15/21 168 lb 4.8 oz (76.3 kg)  07/21/21 172 lb (78 kg)  05/01/21 170 lb 1.6 oz (77.2 kg)   Last seen for diabetes 3 months ago.  Management since then includes Continue current meds Discussed diet and exercise. She reports excellent compliance with treatment. She is not having side effects.  Symptoms: No fatigue No foot ulcerations  No appetite changes No nausea  No paresthesia of the feet  No polydipsia  No polyuria No visual disturbances   No vomiting     Home blood sugar records: fasting range: 150's-170's  Episodes of hypoglycemia? No    Most Recent Eye Exam: Per patient UTD Current exercise: none Current diet habits: well balanced  Pertinent Labs: Lab Results  Component Value Date   CHOL 178 01/28/2021   HDL 57 01/28/2021   LDLCALC 82 01/28/2021   TRIG 237 (H) 01/28/2021   CHOLHDL 3.1 01/28/2021   Lab Results  Component Value Date   NA 142 01/28/2021   K 4.2 01/28/2021   CREATININE 0.75 01/28/2021   EGFR 79 01/28/2021     ---------------------------------------------------------------------------------------------------     Medications: Outpatient Medications Prior to Visit  Medication Sig   blood glucose meter kit and supplies Dispense based on patient and insurance preference. To check blood sugar once daily.   clindamycin (CLEOCIN-T) 1 % lotion Apply  topically daily.   clobetasol (TEMOVATE) 0.05 % external solution    FREESTYLE TEST STRIPS test strip TEST TID UTD   Lancets (FREESTYLE) lancets USE TID   Loratadine 10 MG CAPS Take 10 mg by mouth daily as needed.   metFORMIN (GLUCOPHAGE-XR) 500 MG 24 hr tablet Take 500 mg by mouth 2 (two) times daily.   Multiple Vitamin (MULTIVITAMIN) tablet Take 1 tablet by mouth daily.   olopatadine (PATANOL) 0.1 % ophthalmic solution Place 1 drop into both eyes as needed.   OZEMPIC, 1 MG/DOSE, 4 MG/3ML SOPN INJECT 1MG SUBCUTANEOUSLY  ONCE A WEEK   Semaglutide, 1 MG/DOSE, (OZEMPIC, 1 MG/DOSE,) 2 MG/1.5ML SOPN Inject 1 mg into the skin once a week.   [DISCONTINUED] zolpidem (AMBIEN) 5 MG tablet Take 1 tablet (5 mg total) by mouth at bedtime as needed. Do not fill <30 days from last refill   No facility-administered medications prior to visit.    Review of Systems per HPI     Objective    BP 134/83 (BP Location: Left Arm, Patient Position: Sitting, Cuff Size: Normal)   Pulse 73   Temp (!) 97.5 F (36.4 C) (Oral)   Resp 16   Wt 168 lb 4.8 oz (76.3 kg)   BMI 26.36 kg/m  {Show previous vital signs (optional):23777}  Physical Exam Vitals reviewed.  Constitutional:      General: She is not  in acute distress.    Appearance: Normal appearance. She is well-developed. She is not diaphoretic.  HENT:     Head: Normocephalic and atraumatic.  Eyes:     General: No scleral icterus.    Conjunctiva/sclera: Conjunctivae normal.  Neck:     Thyroid: No thyromegaly.  Cardiovascular:     Rate and Rhythm: Normal rate and regular rhythm.     Pulses: Normal pulses.     Heart sounds: Normal heart sounds. No murmur heard. Pulmonary:     Effort: Pulmonary effort is normal. No respiratory distress.     Breath sounds: Normal breath sounds. No wheezing, rhonchi or rales.  Musculoskeletal:     Cervical back: Neck supple.     Right lower leg: No edema.     Left lower leg: No edema.  Lymphadenopathy:      Cervical: No cervical adenopathy.  Skin:    General: Skin is warm and dry.     Findings: No rash.  Neurological:     Mental Status: She is alert and oriented to person, place, and time. Mental status is at baseline.  Psychiatric:        Mood and Affect: Mood normal.        Behavior: Behavior normal.     Results for orders placed or performed in visit on 08/15/21  POCT glycosylated hemoglobin (Hb A1C)  Result Value Ref Range   Hemoglobin A1C 6.8 (A) 4.0 - 5.6 %   Est. average glucose Bld gHb Est-mCnc 148     Assessment & Plan     Problem List Items Addressed This Visit       Endocrine   T2DM (type 2 diabetes mellitus) ([place]) - Primary    Well controlled with A1c 6.8 Continue current meds Will send ROI for last eye exam      Relevant Orders   POCT glycosylated hemoglobin (Hb A1C) (Completed)   Urine Microalbumin w/creat. ratio   Microalbuminuria due to type 2 diabetes mellitus (HCC)    Reported lethargy on lisinopril - was d/c'd Hold on losartan for now Continue to monitor Urine micro/Cr ratio      Relevant Orders   Urine Microalbumin w/creat. ratio     Other   Insomnia    Longstanding On ambien for many years Have discussed possible risks Continue at current dose - refills sent ot pharmacy        Return in about 6 months (around 02/13/2022) for chronic disease f/u.      I, Lavon Paganini, MD, have reviewed all documentation for this visit. The documentation on 08/15/21 for the exam, diagnosis, procedures, and orders are all accurate and complete.   Natasia Sanko, Dionne Bucy, MD, MPH [facility]

## 2021-08-15 NOTE — Assessment & Plan Note (Signed)
Longstanding On ambien for many years Have discussed possible risks Continue at current dose - refills sent ot pharmacy

## 2021-08-15 NOTE — Assessment & Plan Note (Signed)
Well controlled with A1c 6.8 Continue current meds Will send ROI for last eye exam

## 2021-08-16 LAB — MICROALBUMIN / CREATININE URINE RATIO
Creatinine, Urine: 142.7 mg/dL
Microalb/Creat Ratio: 17 mg/g creat (ref 0–29)
Microalbumin, Urine: 24.8 ug/mL

## 2021-08-28 ENCOUNTER — Ambulatory Visit (INDEPENDENT_AMBULATORY_CARE_PROVIDER_SITE_OTHER): Payer: Medicare Other

## 2021-08-28 DIAGNOSIS — Z0000 Encounter for general adult medical examination without abnormal findings: Secondary | ICD-10-CM | POA: Diagnosis not present

## 2021-08-28 NOTE — Patient Instructions (Signed)
Raven Lewis , Thank you for taking time to come for your Medicare Wellness Visit. I appreciate your ongoing commitment to your health goals. Please review the following plan we discussed and let me know if I can assist you in the future.   Screening recommendations/referrals: Colonoscopy: aged out Mammogram: 07/24/21 Bone Density: no Recommended yearly ophthalmology/optometry visit for glaucoma screening and checkup Recommended yearly dental visit for hygiene and checkup  Vaccinations: Influenza vaccine: 07/15/21 Pneumococcal vaccine: 08/31/13 Tdap vaccine: n/d Shingles vaccine: 10/20/17  zoster 05/08/14   Covid-19:09/25/19, 10/16/19, 06/27/20  Advanced directives: no  Conditions/risks identified: none  Next appointment: Follow up in one year for your annual wellness visit    Preventive Care 19 Years and Older, Female Preventive care refers to lifestyle choices and visits with your health care provider that can promote health and wellness. What does preventive care include? A yearly physical exam. This is also called an annual well check. Dental exams once or twice a year. Routine eye exams. Ask your health care provider how often you should have your eyes checked. Personal lifestyle choices, including: Daily care of your teeth and gums. Regular physical activity. Eating a healthy diet. Avoiding tobacco and drug use. Limiting alcohol use. Practicing safe sex. Taking low-dose aspirin every day. Taking vitamin and mineral supplements as recommended by your health care provider. What happens during an annual well check? The services and screenings done by your health care provider during your annual well check will depend on your age, overall health, lifestyle risk factors, and family history of disease. Counseling  Your health care provider may ask you questions about your: Alcohol use. Tobacco use. Drug use. Emotional well-being. Home and relationship well-being. Sexual  activity. Eating habits. History of falls. Memory and ability to understand (cognition). Work and work Statistician. Reproductive health. Screening  You may have the following tests or measurements: Height, weight, and BMI. Blood pressure. Lipid and cholesterol levels. These may be checked every 5 years, or more frequently if you are over 54 years old. Skin check. Lung cancer screening. You may have this screening every year starting at age 21 if you have a 30-pack-year history of smoking and currently smoke or have quit within the past 15 years. Fecal occult blood test (FOBT) of the stool. You may have this test every year starting at age 61. Flexible sigmoidoscopy or colonoscopy. You may have a sigmoidoscopy every 5 years or a colonoscopy every 10 years starting at age 39. Hepatitis C blood test. Hepatitis B blood test. Sexually transmitted disease (STD) testing. Diabetes screening. This is done by checking your blood sugar (glucose) after you have not eaten for a while (fasting). You may have this done every 1-3 years. Bone density scan. This is done to screen for osteoporosis. You may have this done starting at age 64. Mammogram. This may be done every 1-2 years. Talk to your health care provider about how often you should have regular mammograms. Talk with your health care provider about your test results, treatment options, and if necessary, the need for more tests. Vaccines  Your health care provider may recommend certain vaccines, such as: Influenza vaccine. This is recommended every year. Tetanus, diphtheria, and acellular pertussis (Tdap, Td) vaccine. You may need a Td booster every 10 years. Zoster vaccine. You may need this after age 56. Pneumococcal 13-valent conjugate (PCV13) vaccine. One dose is recommended after age 75. Pneumococcal polysaccharide (PPSV23) vaccine. One dose is recommended after age 26. Talk to your health care provider  about which screenings and vaccines  you need and how often you need them. This information is not intended to replace advice given to you by your health care provider. Make sure you discuss any questions you have with your health care provider. Document Released: 09/27/2015 Document Revised: 05/20/2016 Document Reviewed: 07/02/2015 Elsevier Interactive Patient Education  2017 [place] Prevention in the Home Falls can cause injuries. They can happen to people of all ages. There are many things you can do to make your home safe and to help prevent falls. What can I do on the outside of my home? Regularly fix the edges of walkways and driveways and fix any cracks. Remove anything that might make you trip as you walk through a door, such as a raised step or threshold. Trim any bushes or trees on the path to your home. Use bright outdoor lighting. Clear any walking paths of anything that might make someone trip, such as rocks or tools. Regularly check to see if handrails are loose or broken. Make sure that both sides of any steps have handrails. Any raised decks and porches should have guardrails on the edges. Have any leaves, snow, or ice cleared regularly. Use sand or salt on walking paths during winter. Clean up any spills in your garage right away. This includes oil or grease spills. What can I do in the bathroom? Use night lights. Install grab bars by the toilet and in the tub and shower. Do not use towel bars as grab bars. Use non-skid mats or decals in the tub or shower. If you need to sit down in the shower, use a plastic, non-slip stool. Keep the floor dry. Clean up any water that spills on the floor as soon as it happens. Remove soap buildup in the tub or shower regularly. Attach bath mats securely with double-sided non-slip rug tape. Do not have throw rugs and other things on the floor that can make you trip. What can I do in the bedroom? Use night lights. Make sure that you have a light by your bed that  is easy to reach. Do not use any sheets or blankets that are too big for your bed. They should not hang down onto the floor. Have a firm chair that has side arms. You can use this for support while you get dressed. Do not have throw rugs and other things on the floor that can make you trip. What can I do in the kitchen? Clean up any spills right away. Avoid walking on wet floors. Keep items that you use a lot in easy-to-reach places. If you need to reach something above you, use a strong step stool that has a grab bar. Keep electrical cords out of the way. Do not use floor polish or wax that makes floors slippery. If you must use wax, use non-skid floor wax. Do not have throw rugs and other things on the floor that can make you trip. What can I do with my stairs? Do not leave any items on the stairs. Make sure that there are handrails on both sides of the stairs and use them. Fix handrails that are broken or loose. Make sure that handrails are as long as the stairways. Check any carpeting to make sure that it is firmly attached to the stairs. Fix any carpet that is loose or worn. Avoid having throw rugs at the top or bottom of the stairs. If you do have throw rugs, attach them to the floor  with carpet tape. Make sure that you have a light switch at the top of the stairs and the bottom of the stairs. If you do not have them, ask someone to add them for you. What else can I do to help prevent falls? Wear shoes that: Do not have high heels. Have rubber bottoms. Are comfortable and fit you well. Are closed at the toe. Do not wear sandals. If you use a stepladder: Make sure that it is fully opened. Do not climb a closed stepladder. Make sure that both sides of the stepladder are locked into place. Ask someone to hold it for you, if possible. Clearly mark and make sure that you can see: Any grab bars or handrails. First and last steps. Where the edge of each step is. Use tools that help you  move around (mobility aids) if they are needed. These include: Canes. Walkers. Scooters. Crutches. Turn on the lights when you go into a dark area. Replace any light bulbs as soon as they burn out. Set up your furniture so you have a clear path. Avoid moving your furniture around. If any of your floors are uneven, fix them. If there are any pets around you, be aware of where they are. Review your medicines with your doctor. Some medicines can make you feel dizzy. This can increase your chance of falling. Ask your doctor what other things that you can do to help prevent falls. This information is not intended to replace advice given to you by your health care provider. Make sure you discuss any questions you have with your health care provider. Document Released: 06/27/2009 Document Revised: 02/06/2016 Document Reviewed: 10/05/2014 Elsevier Interactive Patient Education  2017 [employer].

## 2021-08-28 NOTE — Progress Notes (Signed)
Virtual Visit via Telephone Note  I connected with  Raven Lewis on 08/28/21 at  1:40 PM EST by telephone and verified that I am speaking with the correct person using two identifiers.  Location: Patient: home  Provider: [facility] Persons participating in the virtual visit: [place]   I discussed the limitations, risks, security and privacy concerns of performing an evaluation and management service by telephone and the availability of in person appointments. The patient expressed understanding and agreed to proceed.  Interactive audio and video telecommunications were attempted between this nurse and patient, however failed, due to patient having technical difficulties OR patient did not have access to video capability.  We continued and completed visit with audio only.  Some vital signs may be absent or patient reported.   Raven David, LPN  Subjective:   Raven Lewis is a 86 y.o. female who presents for Medicare Annual (Subsequent) preventive examination.  Review of Systems           Objective:    There were no vitals filed for this visit. There is no height or weight on file to calculate BMI.  Advanced Directives 02/20/2019  Does Patient Have a Medical Advance Directive? Yes  Type of Advance Directive Living will;Healthcare Power of Attorney  Does patient want to make changes to medical advance directive? No - Patient declined  Copy of [place] in Chart? No - copy requested    Current Medications (verified) Outpatient Encounter Medications as of 08/28/2021  Medication Sig   blood glucose meter kit and supplies Dispense based on patient and insurance preference. To check blood sugar once daily.   clindamycin (CLEOCIN-T) 1 % lotion Apply topically daily.   clobetasol (TEMOVATE) 0.05 % external solution    FREESTYLE TEST STRIPS test strip TEST TID UTD   Lancets (FREESTYLE) lancets USE TID   Loratadine 10 MG CAPS Take 10 mg by  mouth daily as needed.   metFORMIN (GLUCOPHAGE-XR) 500 MG 24 hr tablet Take 500 mg by mouth 2 (two) times daily.   Multiple Vitamin (MULTIVITAMIN) tablet Take 1 tablet by mouth daily.   olopatadine (PATANOL) 0.1 % ophthalmic solution Place 1 drop into both eyes as needed.   OZEMPIC, 1 MG/DOSE, 4 MG/3ML SOPN INJECT 1MG SUBCUTANEOUSLY  ONCE A WEEK   Semaglutide, 1 MG/DOSE, (OZEMPIC, 1 MG/DOSE,) 2 MG/1.5ML SOPN Inject 1 mg into the skin once a week.   zolpidem (AMBIEN) 5 MG tablet Take 1 tablet (5 mg total) by mouth at bedtime as needed. Do not fill <30 days from last refill   No facility-administered encounter medications on file as of 08/28/2021.    Allergies (verified) Patient has no known allergies.   History: Past Medical History:  Diagnosis Date   Allergic rhinitis    Cataract    Diabetes mellitus without complication (HCC)    Insomnia    MRSA (methicillin resistant Staphylococcus aureus) 2000   right elbow   Thrombocytopenia (HCC)    Thrombocytopenia, idiopathic ([place]) 12/20/2018   Past Surgical History:  Procedure Laterality Date   CATARACT EXTRACTION Left    ELBOW SURGERY     MRSA in right elbow   TUBAL LIGATION     WRIST SURGERY     left wrist   Family History  Problem Relation Age of Onset   Breast cancer Mother 40   Heart disease Sister        heart failure   Lung cancer Brother    Heart disease Sister  Colon cancer Neg Hx    Social History   Socioeconomic History   Marital status: Widowed    Spouse name: Not on file   Number of children: 2   Years of education: Not on file   Highest education level: Not on file  Occupational History   Occupation: retired  Tobacco Use   Smoking status: Former    Packs/day: 1.50    Years: 40.00    Pack years: 60.00    Types: Cigarettes    Quit date: 11/29/2000    Years since quitting: 20.7   Smokeless tobacco: Never  Vaping Use   Vaping Use: Never used  Substance and Sexual Activity   Alcohol use: Yes     Alcohol/week: 1.0 standard drink    Types: 1 Glasses of wine per week    Comment: occasionally, less than weekly   Drug use: Never   Sexual activity: Not Currently  Other Topics Concern   Not on file  Social History Narrative   Not on file   Social Determinants of Health   Financial Resource Strain: Not on file  Food Insecurity: Not on file  Transportation Needs: Not on file  Physical Activity: Not on file  Stress: Not on file  Social Connections: Not on file    Tobacco Counseling Counseling given: Not Answered   Clinical Intake:  Pre-visit preparation completed: Yes  Pain : No/denies pain     Nutritional Risks: None Diabetes: Yes CBG done?: No Did pt. bring in CBG monitor from home?: No  How often do you need to have someone help you when you read instructions, pamphlets, or other written materials from your doctor or pharmacy?: 1 - Never  Diabetic?yes Nutrition Risk Assessment:  Has the patient had any N/V/D within the last 2 months?  No  Does the patient have any non-healing wounds?  No  Has the patient had any unintentional weight loss or weight gain?  No   Diabetes:  Is the patient diabetic?  Yes  If diabetic, was a CBG obtained today?  No  Did the patient bring in their glucometer from home?  No  How often do you monitor your CBG's? Every morning.   Financial Strains and Diabetes Management:  Are you having any financial strains with the device, your supplies or your medication? No .  Does the patient want to be seen by Chronic Care Management for management of their diabetes?  No  Would the patient like to be referred to a Nutritionist or for Diabetic Management?  No   Diabetic Exams:  Diabetic Eye Exam: Completed several months ago.  Pt has been advised about the importance in completing this exam.    Diabetic Foot Exam: Completed 10/29/20. Pt has been advised about the importance in completing this exam.   Interpreter Needed?:  No  Information entered by :: Kirke Shaggy, LPN   Activities of Daily Living In your present state of health, do you have any difficulty performing the following activities: 10/29/2020  Hearing? N  Vision? N  Difficulty concentrating or making decisions? N  Walking or climbing stairs? N  Dressing or bathing? N  Doing errands, shopping? N  Some recent data might be hidden    Patient Care Team: Virginia Crews, MD as PCP - General (Family Medicine)  Indicate any recent Medical Services you may have received from other than [facility] providers in the past year (date may be approximate).     Assessment:   This is a  routine wellness examination for [facility].  Hearing/Vision screen No results found.  Dietary issues and exercise activities discussed:     Goals Addressed   None    Depression Screen PHQ 2/9 Scores 10/29/2020 08/20/2020 07/16/2020 02/20/2019  PHQ - 2 Score 0 0 0 0  PHQ- 9 Score 0 7 - -    Fall Risk Fall Risk  10/29/2020 07/16/2020 03/06/2019 02/20/2019  Falls in the past year? 0 0 0 0  Number falls in past yr: 0 0 - -  Injury with Fall? 0 0 - -  Risk for fall due to : - No Fall Risks - -  Follow up - Falls evaluation completed - -    FALL RISK PREVENTION PERTAINING TO THE HOME:  Any stairs in or around the home? No  If so, are there any without handrails? No  Home free of loose throw rugs in walkways, pet beds, electrical cords, etc? Yes  Adequate lighting in your home to reduce risk of falls? Yes   ASSISTIVE DEVICES UTILIZED TO PREVENT FALLS:  Life alert? No  Use of a cane, walker or w/c? No  Grab bars in the bathroom? No  Shower chair or bench in shower? No  Elevated toilet seat or a handicapped toilet? No     Cognitive Function:Normal cognitive status assessed by direct observation by this Nurse Health Advisor. No abnormalities found.          Immunizations Immunization History  Administered Date(s) Administered   Influenza Inj Mdck Quad Pf  06/20/2018   Influenza,inj,Quad PF,6+ Mos 06/26/2019, 06/12/2020   PFIZER Comirnaty(Gray Top)Covid-19 Tri-Sucrose Vaccine 09/25/2019, 10/16/2019, 06/27/2020   PFIZER(Purple Top)SARS-COV-2 Vaccination 09/25/2019, 10/16/2019, 06/27/2020   Pneumococcal Conjugate-13 08/31/2013   Pneumococcal Polysaccharide-23 10/16/2003   Zoster, Live 05/08/2014    TDAP status: Due, Education has been provided regarding the importance of this vaccine. Advised may receive this vaccine at local pharmacy or Health Dept. Aware to provide a copy of the vaccination record if obtained from local pharmacy or Health Dept. Verbalized acceptance and understanding.  Flu Vaccine status: Up to date  Pneumococcal vaccine status: Up to date  Covid-19 vaccine status: Completed vaccines  Qualifies for Shingles Vaccine? No   Zostavax completed Yes   Shingrix Completed?: Yes  Screening Tests Health Maintenance  Topic Date Due   TETANUS/TDAP  Never done   DEXA SCAN  Never done   Zoster Vaccines- Shingrix (2 of 2) 12/15/2017   COVID-19 Vaccine (6 - Booster) 08/22/2020   FOOT EXAM  10/29/2021   HEMOGLOBIN A1C  02/13/2022   OPHTHALMOLOGY EXAM  03/12/2022   Pneumonia Vaccine 70+ Years old  Completed   INFLUENZA VACCINE  Completed   HPV VACCINES  Aged Out    Health Maintenance  Health Maintenance Due  Topic Date Due   TETANUS/TDAP  Never done   DEXA SCAN  Never done   Zoster Vaccines- Shingrix (2 of 2) 12/15/2017   COVID-19 Vaccine (6 - Booster) 08/22/2020    Colorectal cancer screening: No longer required.   Mammogram status: Completed 07/24/21. Repeat every year   Lung Cancer Screening: (Low Dose CT Chest recommended if Age 76-80 years, 30 pack-year currently smoking OR have quit w/in 15years.) does not qualify.   Additional Screening:  Hepatitis C Screening: does not qualify; Completed no  Vision Screening: Recommended annual ophthalmology exams for early detection of glaucoma and other disorders of the  eye. Is the patient up to date with their annual eye exam?  Yes  Who  is the provider or what is the name of the office in which the patient attends annual eye exams? Several months ago on [place] Screening: Recommended annual dental exams for proper oral hygiene  Community Resource Referral / Chronic Care Management: CRR required this visit?  No   CCM required this visit?  No      Plan:     I have personally reviewed and noted the following in the patients chart:   Medical and social history Use of alcohol, tobacco or illicit drugs  Current medications and supplements including opioid prescriptions.  Functional ability and status Nutritional status Physical activity Advanced directives List of other physicians Hospitalizations, surgeries, and ER visits in previous 12 months Vitals Screenings to include cognitive, depression, and falls Referrals and appointments  In addition, I have reviewed and discussed with patient certain preventive protocols, quality metrics, and best practice recommendations. A written personalized care plan for preventive services as well as general preventive health recommendations were provided to patient.     Raven David, LPN   [id number]   Nurse Notes: none

## 2021-09-15 ENCOUNTER — Other Ambulatory Visit: Payer: Self-pay | Admitting: Family Medicine

## 2021-09-15 DIAGNOSIS — E1129 Type 2 diabetes mellitus with other diabetic kidney complication: Secondary | ICD-10-CM

## 2021-09-16 ENCOUNTER — Telehealth: Payer: Self-pay | Admitting: Family Medicine

## 2021-09-16 DIAGNOSIS — R809 Proteinuria, unspecified: Secondary | ICD-10-CM

## 2021-09-16 DIAGNOSIS — E1129 Type 2 diabetes mellitus with other diabetic kidney complication: Secondary | ICD-10-CM

## 2021-09-16 MED ORDER — FREESTYLE LANCETS MISC: 2 refills | Status: DC

## 2021-09-16 MED ORDER — FREESTYLE TEST VI STRP: ORAL_STRIP | 2 refills | Status: DC

## 2021-09-16 NOTE — Telephone Encounter (Signed)
Ambien refilled 08/15/21 #[id number]

## 2021-09-16 NOTE — Telephone Encounter (Signed)
[facility] faxed refill request for the following medications:  Lancets (FREESTYLE) lancets   zolpidem (AMBIEN) 5 MG tablet   FREESTYLE TEST STRIPS test strip   Please advise.

## 2021-09-17 ENCOUNTER — Other Ambulatory Visit: Payer: Self-pay | Admitting: Family Medicine

## 2021-09-17 NOTE — Telephone Encounter (Signed)
[facility] faxed refill request for the following medications:  metFORMIN (GLUCOPHAGE-XR) 500 MG 24 hr tablet   Please advise.  

## 2021-09-17 NOTE — Telephone Encounter (Signed)
Is patient still on Meformin? Or just on Ozempic.

## 2021-09-18 NOTE — Telephone Encounter (Signed)
I think that she is off of Metformin. Can you double check with her?

## 2021-09-22 ENCOUNTER — Telehealth: Payer: Self-pay | Admitting: Family Medicine

## 2021-09-22 MED ORDER — METFORMIN HCL ER 500 MG PO TB24: 500.0000 mg | ORAL_TABLET | Freq: Two times a day (BID) | ORAL | 0 refills | Status: DC

## 2021-09-22 NOTE — Telephone Encounter (Signed)
This PA according to cover my meds is in process.

## 2021-09-22 NOTE — Telephone Encounter (Signed)
Patient insurance requires PA for prescription coverage

## 2021-09-22 NOTE — Telephone Encounter (Signed)
Per patient she is taking the Metformin. Last refill for her was 12/2020 not sure how much because it doesn't have a qty. Sent in for patient qty:90 R:0

## 2021-09-22 NOTE — Telephone Encounter (Signed)
Medication Refill - Medicazolpidem (AMBIEN) 5 MG tablet tion:    Has the patient contacted their pharmacy? Yes.   Contact PCP, needs a pre-approval.  Preferred Pharmacy (with phone number or street name):  [id number] - [place], [place] - [place] [address] [place]  [address] [place], [place] [address]  Phone:  [phone]  Fax:  [phone]   Has the patient been seen for an appointment in the last year OR does the patient have an upcoming appointment? Yes.    Agent: Please be advised that RX refills may take up to 3 business days. We ask that you follow-up with your pharmacy.

## 2021-09-23 ENCOUNTER — Encounter: Payer: Self-pay | Admitting: Family Medicine

## 2021-09-23 NOTE — Telephone Encounter (Signed)
Patient was called and was advised that Zolpidem was approved.

## 2021-09-23 NOTE — Telephone Encounter (Signed)
Called PA line on patient's insurance card and said Zolpidem is approved. Patient advised.

## 2021-10-08 ENCOUNTER — Other Ambulatory Visit: Payer: Self-pay

## 2021-10-08 ENCOUNTER — Encounter: Payer: Self-pay | Admitting: Dermatology

## 2021-10-08 ENCOUNTER — Ambulatory Visit (INDEPENDENT_AMBULATORY_CARE_PROVIDER_SITE_OTHER): Payer: Medicare Other | Admitting: Dermatology

## 2021-10-08 DIAGNOSIS — L578 Other skin changes due to chronic exposure to nonionizing radiation: Secondary | ICD-10-CM | POA: Diagnosis not present

## 2021-10-08 DIAGNOSIS — Z1283 Encounter for screening for malignant neoplasm of skin: Secondary | ICD-10-CM | POA: Diagnosis not present

## 2021-10-08 DIAGNOSIS — L732 Hidradenitis suppurativa: Secondary | ICD-10-CM

## 2021-10-08 DIAGNOSIS — C434 Malignant melanoma of scalp and neck: Secondary | ICD-10-CM

## 2021-10-08 DIAGNOSIS — L821 Other seborrheic keratosis: Secondary | ICD-10-CM

## 2021-10-08 DIAGNOSIS — D229 Melanocytic nevi, unspecified: Secondary | ICD-10-CM

## 2021-10-08 DIAGNOSIS — L814 Other melanin hyperpigmentation: Secondary | ICD-10-CM

## 2021-10-08 DIAGNOSIS — D492 Neoplasm of unspecified behavior of bone, soft tissue, and skin: Secondary | ICD-10-CM

## 2021-10-08 DIAGNOSIS — C4362 Malignant melanoma of left upper limb, including shoulder: Secondary | ICD-10-CM | POA: Diagnosis not present

## 2021-10-08 DIAGNOSIS — D1800 Hemangioma unspecified site: Secondary | ICD-10-CM

## 2021-10-08 NOTE — Patient Instructions (Addendum)
Melanoma ABCDEs  Melanoma is the most dangerous type of skin cancer, and is the leading cause of death from skin disease.  You are more likely to develop melanoma if you: Have light-colored skin, light-colored eyes, or red or blond hair Spend a lot of time in the sun Tan regularly, either outdoors or in a tanning bed Have had blistering sunburns, especially during childhood Have a close family member who has had a melanoma Have atypical moles or large birthmarks  Early detection of melanoma is key since treatment is typically straightforward and cure rates are extremely high if we catch it early.   The first sign of melanoma is often a change in a mole or a new dark spot.  The ABCDE system is a way of remembering the signs of melanoma.  A for asymmetry:  The two halves do not match. B for border:  The edges of the growth are irregular. C for color:  A mixture of colors are present instead of an even brown color. D for diameter:  Melanomas are usually (but not always) greater than 77mm - the size of a pencil eraser. E for evolution:  The spot keeps changing in size, shape, and color.  Please check your skin once per month between visits. You can use a small mirror in front and a large mirror behind you to keep an eye on the back side or your body.   If you see any new or changing lesions before your next follow-up, please call to schedule a visit.  Please continue daily skin protection including broad spectrum sunscreen SPF 30+ to sun-exposed areas, reapplying every 2 hours as needed when you're outdoors.   Staying in the shade or wearing long sleeves, sun glasses (UVA+UVB protection) and wide brim hats (4-inch brim around the entire circumference of the hat) are also recommended for sun protection.    Wound Care Instructions  Cleanse wound gently with soap and water once a day then pat dry with clean gauze. Apply a thing coat of Petrolatum (petroleum jelly, "Vaseline") over the wound  (unless you have an allergy to this). We recommend that you use a new, sterile tube of Vaseline. Do not pick or remove scabs. Do not remove the yellow or white "healing tissue" from the base of the wound.  Cover the wound with fresh, clean, nonstick gauze and secure with paper tape. You may use Band-Aids in place of gauze and tape if the would is small enough, but would recommend trimming much of the tape off as there is often too much. Sometimes Band-Aids can irritate the skin.  You should call the office for your biopsy report after 1 week if you have not already been contacted.  If you experience any problems, such as abnormal amounts of bleeding, swelling, significant bruising, significant pain, or evidence of infection, please call the office immediately.  FOR ADULT SURGERY PATIENTS: If you need something for pain relief you may take 1 extra strength Tylenol (acetaminophen) AND 2 Ibuprofen (200mg  each) together every 4 hours as needed for pain. (do not take these if you are allergic to them or if you have a reason you should not take them.) Typically, you may only need pain medication for 1 to 3 days.   Recommend taking Heliocare sun protection supplement daily in sunny weather for additional sun protection. For maximum protection on the sunniest days, you can take up to 2 capsules of regular Heliocare OR take 1 capsule of Heliocare Ultra. For prolonged  exposure (such as a full day in the sun), you can repeat your dose of the supplement 4 hours after your first dose. Heliocare can be purchased at [employer], at some [facility] or at [email or web address].    Recommend daily broad spectrum sunscreen SPF 30+ to sun-exposed areas, reapply every 2 hours as needed. Call for new or changing lesions.  Staying in the shade or wearing long sleeves, sun glasses (UVA+UVB protection) and wide brim hats (4-inch brim around the entire circumference of the hat) are also recommended for sun protection.   If  You Need Anything After Your Visit  If you have any questions or concerns for your doctor, please call our main line at [phone] and press option 4 to reach your doctor's medical assistant. If no one answers, please leave a voicemail as directed and we will return your call as soon as possible. Messages left after 4 pm will be answered the following business day.   You may also send [place] a message via [place]. We typically respond to MyChart messages within 1-2 business days.  For prescription refills, please ask your pharmacy to contact our office. Our fax number is [phone].  If you have an urgent issue when the clinic is closed that cannot wait until the next business day, you can page your doctor at the number below.    Please note that while we do our best to be available for urgent issues outside of office hours, we are not available 24/7.   If you have an urgent issue and are unable to reach [place], you may choose to seek medical care at your doctor's office, retail clinic, urgent care center, or emergency room.  If you have a medical emergency, please immediately call 911 or go to the emergency department.  Pager Numbers  - Dr. Nehemiah Massed: [phone]  - Dr. Laurence Ferrari: [phone]  - Dr. Nicole Kindred: [phone]  In the event of inclement weather, please call our main line at [phone] for an update on the status of any delays or closures.  Dermatology Medication Tips: Please keep the boxes that topical medications come in in order to help keep track of the instructions about where and how to use these. Pharmacies typically print the medication instructions only on the boxes and not directly on the medication tubes.   If your medication is too expensive, please contact our office at [phone] option 4 or send [place] a message through [place].   We are unable to tell what your co-pay for medications will be in advance as this is different depending on your insurance coverage.  However, we may be able to find a substitute medication at lower cost or fill out paperwork to get insurance to cover a needed medication.   If a prior authorization is required to get your medication covered by your insurance company, please allow [place] 1-2 business days to complete this process.  Drug prices often vary depending on where the prescription is filled and some pharmacies may offer cheaper prices.  The website www.goodrx.com contains coupons for medications through different pharmacies. The prices here do not account for what the cost may be with help from insurance (it may be cheaper with your insurance), but the website can give you the price if you did not use any insurance.  - You can print the associated coupon and take it with your prescription to the pharmacy.  - You may also stop by our office during regular business hours and pick up a GoodRx  coupon card.  - If you need your prescription sent electronically to a different pharmacy, notify our office through [facility] or by phone at [phone] option 4.     Si Usted Necesita Algo Despus de Su Visita  Tambin puede enviarnos un mensaje a travs de Pharmacist, community. Por lo general respondemos a los mensajes de MyChart en el transcurso de 1 a 2 das hbiles.  Para renovar recetas, por favor pida a su farmacia que se ponga en contacto con nuestra oficina. Harland Dingwall de fax es [place] [phone].  Si tiene un asunto urgente cuando la clnica est cerrada y que no puede esperar hasta el siguiente da hbil, puede llamar/localizar a su doctor(a) al nmero que aparece a continuacin.   Por favor, tenga en cuenta que aunque hacemos todo lo posible para estar disponibles para asuntos urgentes fuera del horario de [place], no estamos disponibles las 24 horas del da, los 7 das de la [place].   Si tiene un problema urgente y no puede comunicarse con nosotros, puede optar por buscar atencin mdica  en el consultorio de su  doctor(a), en una clnica privada, en un centro de atencin urgente o en una sala de emergencias.  Si tiene Engineering geologist, por favor llame inmediatamente al 911 o vaya a la sala de emergencias.  Nmeros de bper  - Dr. Nehemiah Massed: [phone]  - Dra. Moye: [phone]  - Dra. Nicole Kindred: [phone]  En caso de inclemencias del [place], por favor llame a Johnsie Kindred principal al [phone] para una actualizacin sobre el [place] de cualquier retraso o cierre.  Consejos para la medicacin en dermatologa: Por favor, guarde las cajas en las que vienen los medicamentos de uso tpico para ayudarle a seguir las instrucciones sobre dnde y cmo usarlos. Las farmacias generalmente imprimen las instrucciones del medicamento slo en las cajas y no directamente en los tubos del [place].   Si su medicamento es muy caro, por favor, pngase en contacto con Zigmund Daniel llamando al [phone] y presione la opcin 4 o envenos un mensaje a travs de Pharmacist, community.   No podemos decirle cul ser su copago por los medicamentos por adelantado ya que esto es diferente dependiendo de la cobertura de su seguro. Sin embargo, es posible que podamos encontrar un medicamento sustituto a Electrical engineer un formulario para que el seguro cubra el medicamento que se considera necesario.   Si se requiere una autorizacin previa para que su compaa de seguros [place] su medicamento, por favor permtanos de 1 a 2 das hbiles para completar este proceso.  Los precios de los medicamentos varan con frecuencia dependiendo del Environmental consultant de dnde se surte la receta y alguna farmacias pueden ofrecer precios ms baratos.  El sitio web www.goodrx.com tiene cupones para medicamentos de Airline pilot. Los precios aqu no tienen en cuenta lo que podra costar con la ayuda del seguro (puede ser ms barato con su seguro), pero el sitio web puede darle el precio si no utiliz Research scientist (physical sciences).  - Puede imprimir el cupn  correspondiente y llevarlo con su receta a la farmacia.  - Tambin puede pasar por nuestra oficina durante el horario de atencin regular y Charity fundraiser una tarjeta de cupones de GoodRx.  - Si necesita que su receta se enve electrnicamente a una farmacia diferente, informe a nuestra oficina a travs de MyChart de [place] o por telfono llamando al [phone] y presione la opcin 4.

## 2021-10-08 NOTE — Progress Notes (Signed)
Follow-Up Visit   Subjective  Raven Lewis is a 87 y.o. female who presents for the following: FBSE (Here for skin cancer screening. 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 Being treated at [facility] ).  The patient presents for Total-Body Skin Exam (TBSE) for skin cancer screening and mole check.  The patient has spots, moles and lesions to be evaluated, some may be new or changing and the patient has concerns that these could be cancer.  The following portions of the chart were reviewed this encounter and updated as appropriate:  Tobacco   Allergies   Meds   Problems   Med Hx   Surg Hx   Fam Hx       Review of Systems: No other skin or systemic complaints except as noted in HPI or Assessment and Plan.   Objective  Well appearing patient in no apparent distress; mood and affect are within normal limits.  A full examination was performed including scalp, head, eyes, ears, nose, lips, neck, chest, axillae, abdomen, back, buttocks, bilateral upper extremities, bilateral lower extremities, hands, feet, fingers, toes, fingernails, and toenails. All findings within normal limits unless otherwise noted below.  Left Upper Arm Dark brown papule 0.4cm; larger than previous exam       groin area scars   Assessment & Plan  Neoplasm of skin Left Upper Arm  Epidermal / dermal shaving  Lesion diameter (cm):  0.4 Informed consent: discussed and consent obtained   Timeout: patient name, date of birth, surgical site, and procedure verified   Patient was prepped and draped in usual sterile fashion: Area prepped with alcohol. Anesthesia: the lesion was anesthetized in a standard fashion   Anesthetic:  1% lidocaine w/ epinephrine 1-100,000 buffered w/ 8.4% NaHCO3 Instrument used: flexible razor blade   Hemostasis achieved with: pressure, aluminum chloride and electrodesiccation   Outcome: patient tolerated procedure well   Post-procedure details: wound care instructions given    Post-procedure details comment:  Ointment and small bandage applied Additional details:  Mupirocin and a bandage applied  Specimen 1 - Surgical pathology Differential Diagnosis: R/O MM  Check Margins: No  Has MM on scalp being treated at [facility]   Hidradenitis suppurativa groin area  Chronic condition with duration or expected duration over one year. Currently well-controlled.   Continue Clindamycin as directed.  Continue Hibiclens as directed.   Hidradenitis Suppurativa is a chronic; persistent; non-curable, but treatable condition due to abnormal inflamed sweat glands in the body folds (axilla, inframammary, groin, medial thighs), causing recurrent painful draining cysts and scarring. It can be associated with severe scarring acne and cysts; also abscesses and scarring of scalp. The goal is control and prevention of flares, as it is not curable. Scars are permanent and can be thickened. Treatment may include daily use of topical medication and oral antibiotics.  Oral isotretinoin may also be helpful.  For more severe cases, Humira (a biologic injection) may be prescribed to decrease the inflammatory process and prevent flares.  When indicated, inflamed cysts may also be treated surgically.   Related Medications clindamycin (CLEOCIN-T) 1 % lotion Apply topically daily.   Lentigines - Scattered tan macules - Due to sun exposure - Benign-appearing, observe - Recommend daily broad spectrum sunscreen SPF 30+ to sun-exposed areas, reapply every 2 hours as needed. - Call for any changes  Seborrheic Keratoses - Stuck-on, waxy, tan-brown papules and/or plaques  - Benign-appearing - Discussed benign etiology and prognosis. - Observe - Call for any changes  Melanocytic Nevi -  Tan-brown and/or pink-flesh-colored symmetric macules and papules - Benign appearing on exam today - Observation - Call clinic for new or changing moles - Recommend daily use of broad spectrum spf 30+  sunscreen to sun-exposed areas.   Hemangiomas - Red papules - Discussed benign nature - Observe - Call for any changes  Actinic Damage - Chronic condition, secondary to cumulative UV/sun exposure - diffuse scaly erythematous macules with underlying dyspigmentation - Recommend daily broad spectrum sunscreen SPF 30+ to sun-exposed areas, reapply every 2 hours as needed.  - Staying in the shade or wearing long sleeves, sun glasses (UVA+UVB protection) and wide brim hats (4-inch brim around the entire circumference of the hat) are also recommended for sun protection.  - Call for new or changing lesions.  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  - Call if any new or changing lesions are noted between office visits   Skin cancer screening performed today.   Return in about 3 months (around 01/06/2022) for TBSE.  I, Emelia Salisbury, CMA, am acting as scribe for Forest Gleason, MD.  Documentation: I have reviewed the above documentation for accuracy and completeness, and I agree with the above.  Forest Gleason, MD

## 2021-10-10 ENCOUNTER — Encounter: Payer: Self-pay | Admitting: Dermatology

## 2021-11-02 ENCOUNTER — Other Ambulatory Visit: Payer: Self-pay | Admitting: Family Medicine

## 2021-11-27 ENCOUNTER — Other Ambulatory Visit: Payer: Self-pay | Admitting: Dermatology

## 2021-12-03 ENCOUNTER — Encounter: Payer: Self-pay | Admitting: Dermatology

## 2021-12-17 ENCOUNTER — Other Ambulatory Visit: Payer: Self-pay | Admitting: Family Medicine

## 2022-01-06 ENCOUNTER — Ambulatory Visit (INDEPENDENT_AMBULATORY_CARE_PROVIDER_SITE_OTHER): Payer: Medicare Other | Admitting: Dermatology

## 2022-01-06 DIAGNOSIS — L304 Erythema intertrigo: Secondary | ICD-10-CM | POA: Diagnosis not present

## 2022-01-06 DIAGNOSIS — L820 Inflamed seborrheic keratosis: Secondary | ICD-10-CM

## 2022-01-06 DIAGNOSIS — Z1283 Encounter for screening for malignant neoplasm of skin: Secondary | ICD-10-CM

## 2022-01-06 DIAGNOSIS — C434 Malignant melanoma of scalp and neck: Secondary | ICD-10-CM

## 2022-01-06 DIAGNOSIS — L578 Other skin changes due to chronic exposure to nonionizing radiation: Secondary | ICD-10-CM

## 2022-01-06 DIAGNOSIS — Z85820 Personal history of malignant melanoma of skin: Secondary | ICD-10-CM

## 2022-01-06 DIAGNOSIS — L719 Rosacea, unspecified: Secondary | ICD-10-CM | POA: Diagnosis not present

## 2022-01-06 DIAGNOSIS — D229 Melanocytic nevi, unspecified: Secondary | ICD-10-CM

## 2022-01-06 DIAGNOSIS — L732 Hidradenitis suppurativa: Secondary | ICD-10-CM

## 2022-01-06 DIAGNOSIS — L821 Other seborrheic keratosis: Secondary | ICD-10-CM

## 2022-01-06 DIAGNOSIS — D1800 Hemangioma unspecified site: Secondary | ICD-10-CM

## 2022-01-06 DIAGNOSIS — L814 Other melanin hyperpigmentation: Secondary | ICD-10-CM

## 2022-01-06 MED ORDER — CLINDAMYCIN PHOSPHATE 1 % EX LOTN: TOPICAL_LOTION | CUTANEOUS | 3 refills | Status: DC

## 2022-01-06 MED ORDER — KETOCONAZOLE 2 % EX CREA: 1.0000 "application " | TOPICAL_CREAM | CUTANEOUS | 3 refills | Status: AC

## 2022-01-06 MED ORDER — DOXYCYCLINE MONOHYDRATE 100 MG PO CAPS: 100.0000 mg | ORAL_CAPSULE | Freq: Two times a day (BID) | ORAL | 0 refills | Status: AC

## 2022-01-06 NOTE — Patient Instructions (Addendum)
For Painful areas at groin ? ?Start doxycycline 100 mg take 1 capsule by mouth twice daily with food and drink. ?Doxycycline should be taken with food to prevent nausea. Do not lay down for 30 minutes after taking. Be cautious with sun exposure and use good sun protection while on this medication. Pregnant women should not take this medication.  ? ?Continue Clindamyin Lotion - apply daily after shower. ?Continue Hibiclens wash daily to affected areas of groin ?Continue Mupirocin to any open sores at groin. ?Can use warm compress to groin to help drain  ? ? ?Rash at inner thighs ? ?Start Ketoconazole cream - apply 1 - 2 times daily to affected areas of inner thighs for rash  ? ? ?Seborrheic Keratosis ? ?What causes seborrheic keratoses? ?Seborrheic keratoses are harmless, common skin growths that first appear during adult life.  As time goes by, more growths appear.  Some people may develop a large number of them.  Seborrheic keratoses appear on both covered and uncovered body parts.  They are not caused by sunlight.  The tendency to develop seborrheic keratoses can be inherited.  They vary in color from skin-colored to gray, brown, or even black.  They can be either smooth or have a rough, warty surface.   ?Seborrheic keratoses are superficial and look as if they were stuck on the skin.  Under the microscope this type of keratosis looks like layers upon layers of skin.  That is why at times the top layer may seem to fall off, but the rest of the growth remains and re-grows.   ? ?Treatment ?Seborrheic keratoses do not need to be treated, but can easily be removed in the office.  Seborrheic keratoses often cause symptoms when they rub on clothing or jewelry.  Lesions can be in the way of shaving.  If they become inflamed, they can cause itching, soreness, or burning.  Removal of a seborrheic keratosis can be accomplished by freezing, burning, or surgery. ?If any spot bleeds, scabs, or grows rapidly, please return to  have it checked, as these can be an indication of a skin cancer. ? ?Cryotherapy Aftercare ? ?Wash gently with soap and water everyday.   ?Apply Vaseline and Band-Aid daily until healed.  ? ? ?Melanoma ABCDEs ? ?Melanoma is the most dangerous type of skin cancer, and is the leading cause of death from skin disease.  You are more likely to develop melanoma if you: ?Have light-colored skin, light-colored eyes, or red or blond hair ?Spend a lot of time in the sun ?Tan regularly, either outdoors or in a tanning bed ?Have had blistering sunburns, especially during childhood ?Have a close family member who has had a melanoma ?Have atypical moles or large birthmarks ? ?Early detection of melanoma is key since treatment is typically straightforward and cure rates are extremely high if we catch it early.  ? ?The first sign of melanoma is often a change in a mole or a new dark spot.  The ABCDE system is a way of remembering the signs of melanoma. ? ?A for asymmetry:  The two halves do not match. ?B for border:  The edges of the growth are irregular. ?C for color:  A mixture of colors are present instead of an even brown color. ?D for diameter:  Melanomas are usually (but not always) greater than 68m - the size of a pencil eraser. ?E for evolution:  The spot keeps changing in size, shape, and color. ? ?Please check your skin once  per month between visits. You can use a small mirror in front and a large mirror behind you to keep an eye on the back side or your body.  ? ?If you see any new or changing lesions before your next follow-up, please call to schedule a visit. ? ?Please continue daily skin protection including broad spectrum sunscreen SPF 30+ to sun-exposed areas, reapplying every 2 hours as needed when you're outdoors.  ? ?Staying in the shade or wearing long sleeves, sun glasses (UVA+UVB protection) and wide brim hats (4-inch brim around the entire circumference of the hat) are also recommended for sun protection.    ? ?If You Need Anything After Your Visit ? ?If you have any questions or concerns for your doctor, please call our main line at [phone] and press option 4 to reach your doctor's medical assistant. If no one answers, please leave a voicemail as directed and we will return your call as soon as possible. Messages left after 4 pm will be answered the following business day.  ? ?You may also send [place] a message via MyChart. We typically respond to MyChart messages within 1-2 business days. ? ?For prescription refills, please ask your pharmacy to contact our office. Our fax number is [phone]. ? ?If you have an urgent issue when the clinic is closed that cannot wait until the next business day, you can page your doctor at the number below.   ? ?Please note that while we do our best to be available for urgent issues outside of office hours, we are not available 24/7.  ? ?If you have an urgent issue and are unable to reach [place], you may choose to seek medical care at your doctor's office, retail clinic, urgent care center, or emergency room. ? ?If you have a medical emergency, please immediately call 911 or go to the emergency department. ? ?Pager Numbers ? ?- Dr. Nehemiah Massed: [phone] ? ?- Dr. Laurence Ferrari: [phone] ? ?- Dr. Nicole Kindred: [phone] ? ?In the event of inclement weather, please call our main line at [phone] for an update on the status of any delays or closures. ? ?Dermatology Medication Tips: ?Please keep the boxes that topical medications come in in order to help keep track of the instructions about where and how to use these. Pharmacies typically print the medication instructions only on the boxes and not directly on the medication tubes.  ? ?If your medication is too expensive, please contact our office at [phone] option 4 or send [place] a message through [place].  ? ?We are unable to tell what your co-pay for medications will be in advance as this is different depending on your insurance coverage.  However, we may be able to find a substitute medication at lower cost or fill out paperwork to get insurance to cover a needed medication.  ? ?If a prior authorization is required to get your medication covered by your insurance company, please allow [place] 1-2 business days to complete this process. ? ?Drug prices often vary depending on where the prescription is filled and some pharmacies may offer cheaper prices. ? ?The website www.goodrx.com contains coupons for medications through different pharmacies. The prices here do not account for what the cost may be with help from insurance (it may be cheaper with your insurance), but the website can give you the price if you did not use any insurance.  ?- You can print the associated coupon and take it with your prescription to the pharmacy.  ?- You may also  stop by our office during regular business hours and pick up a GoodRx coupon card.  ?- If you need your prescription sent electronically to a different pharmacy, notify our office through [facility] or by phone at [phone] option 4. ? ? ? ? ?Si Usted Necesita Algo Despu?s de Su Visita ? ?Tambi?n puede enviarnos un mensaje a trav?s de MyChart. Por lo general respondemos a los mensajes de MyChart en el transcurso de 1 a 2 d?as h?biles. ? ?Para renovar recetas, por favor pida a su farmacia que se ponga en contacto con nuestra oficina. Nuestro n?mero de fax es el [phone]. ? ?Si tiene un asunto urgente cuando la cl?nica est? cerrada y que no puede esperar hasta el siguiente d?a h?bil, puede llamar/localizar a su doctor(a) al n?mero que aparece a continuaci?n.  ? ?Por favor, tenga en cuenta que aunque hacemos todo lo posible para estar disponibles para asuntos urgentes fuera del horario de oficina, no estamos disponibles las 24 horas del d?a, los 7 d?as de la semana.  ? ?Si tiene un problema urgente y no puede comunicarse con nosotros, puede optar por buscar atenci?n m?dica  en el consultorio de su  doctor(a), en una cl?nica privada, en un centro de atenci?n urgente o en una sala de emergencias. ? ?Si tiene Engineer, maintenance (IT) m?dica, por favor llame inmediatamente al 911 o vaya a la sala de emergencias. ? ?N?me

## 2022-01-06 NOTE — Progress Notes (Signed)
? ?Follow-Up Visit ?  ?Subjective  ?Raven Lewis is a 87 y.o. female who presents for the following: Annual Exam (Hx of aks, isks, hs at groin area using clindamycin lotions / hibiclens. 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 She is s/p T-Vec injections. Patient states she only has 3 more nivo treatments to go.  ).  Scalp melanoma is responding well, and no mets beyond scalp area.  She also has 2nd melanoma on L upper arm which has not been excised due to chemo treatment. ? ? ?The patient presents for Total-Body Skin Exam (TBSE) for skin cancer screening and mole check.  The patient has spots, moles and lesions to be evaluated, some may be new or changing and the patient has concerns that these could be cancer. ? ?The following portions of the chart were reviewed this encounter and updated as appropriate:   ?  ? ?Objective  ?Well appearing patient in no apparent distress; mood and affect are within normal limits. ? ?A full examination was performed including scalp, head, eyes, ears, nose, lips, neck, chest, axillae, abdomen, back, buttocks, bilateral upper extremities, bilateral lower extremities, hands, feet, fingers, toes, fingernails, and toenails. All findings within normal limits unless otherwise noted below. ? ?groin area ?Violaceous nodule on left perineal crease  ? ?left wrist x 1, right posterior arm x 1, left upper arm inferior to bx sitex 1, left medial calf x 1, right medial ankle x 1, (5) ?Erythematous stuck-on, waxy papule or plaque ? ?Head - Anterior (Face) ?Mild eyrthema with inflammatory papules at malar cheeks ? ?Left Inguinal Area ?erythema at inguinal crease  ? ? ?Assessment & Plan  ?Hidradenitis suppurativa ?groin area ? ?Chronic condition with duration or expected duration over one year. Currently inflamed.  ? ?Hidradenitis Suppurativa is a chronic;  persistent; non-curable, but treatable condition due to abnormal inflamed sweat glands in the body folds (axilla, inframammary, groin, medial thighs), causing recurrent painful draining cysts and scarring. It can be associated with severe scarring acne and cysts; also abscesses and scarring of scalp. The goal is control and prevention of flares, as it is not curable. Scars are permanent and can be thickened. Treatment may include daily use of topical medication and oral antibiotics.  Oral isotretinoin may also be helpful.  For more severe cases, Humira (a biologic injection) may be prescribed to decrease the inflammatory process and prevent flares.  When indicated, inflamed cysts may also be treated surgically ?  ?Start doxycycline 100 mg take 1 capsule by mouth twice daily with food and drink. ?Doxycycline should be taken with food to prevent nausea. Do not lay down for 30 minutes after taking. Be cautious with sun exposure and use good sun protection while on this medication. Pregnant women should not take this medication.  ? ?Continue Clindamycin Lotion - apply daily after shower. ?Continue Hibiclens wash daily to affected areas of groin ?Continue Mupirocin to any open sores at groin. ?Can use warm compress to groin to help promote drainage  ?  ? ? ?doxycycline (MONODOX) 100 MG capsule - groin area ?Take 1 capsule (100 mg total) by mouth 2 (two) times daily for 10 days. Take with food and drink. Do not lay down for 30 minutes after taking. ? ?clindamycin (CLEOCIN T) 1 % lotion - groin area ?APPLY TOPICALLY TO THE AFFECTED AREA  of groin daily after shower. Can also use  for pimples at face ? ?Inflamed seborrheic keratosis (5) ?left wrist x 1, right posterior arm x 1, left upper arm inferior to bx sitex 1, left medial calf x 1, right medial ankle x 1, ? ?Destruction of lesion - left wrist x 1, right posterior arm x 1, left upper arm inferior to bx sitex 1, left medial calf x 1, right medial ankle x  1, ? ?Destruction method: cryotherapy   ?Informed consent: discussed and consent obtained   ?Lesion destroyed using liquid nitrogen: Yes   ?Region frozen until ice ball extended beyond lesion: Yes   ?Outcome: patient tolerated procedure well with no complications   ?Post-procedure details: wound care instructions given   ?Additional details:  Prior to procedure, discussed risks of blister formation, small wound, skin dyspigmentation, or rare scar following cryotherapy. Recommend Vaseline ointment to treated areas while healing. ? ? ?Rosacea ?Head - Anterior (Face) ? ?Rosacea is a chronic progressive skin condition usually affecting the face of adults, causing redness and/or acne bumps. It is treatable but not curable. It sometimes affects the eyes (ocular rosacea) as well. It may respond to topical and/or systemic medication and can flare with stress, sun exposure, alcohol, exercise and some foods.  Daily application of broad spectrum spf 30+ sunscreen to face is recommended to reduce flares. ? ?Patient deferred treatment at this time. ? ?Recommend daily sunscreen  ? ?Erythema intertrigo ?Left Inguinal Area ? ?Intertrigo is a chronic recurrent rash that occurs in skin fold areas that may be associated with friction; heat; moisture; yeast; fungus; and bacteria.  It is exacerbated by increased movement / activity; sweating; and higher atmospheric temperature. ? ?Ketoconazole cream - apply to aa's of inguinal crease 1 - 2 times daily for rash  ? ?ketoconazole (NIZORAL) 2 % cream - Left Inguinal Area ?Apply 1 application. topically See admin instructions. Use 1 - 2 times daily to aa's at inner thighs for rash ? ?Lentigines ?- Scattered tan macules ?- Due to sun exposure ?- Benign-appearing, observe ?- Recommend daily broad spectrum sunscreen SPF 30+ to sun-exposed areas, reapply every 2 hours as needed. ?- Call for any changes ? ?Seborrheic Keratoses ?- Stuck-on, waxy, tan-brown papules and/or plaques  ?-  Benign-appearing ?- Discussed benign etiology and prognosis. ?- Observe ?- Call for any changes ? ?Melanocytic Nevi ?- Tan-brown and/or pink-flesh-colored symmetric macules and papules ?- Benign appearing on exam today ?- Observation ?- Call clinic for new or changing moles ?- Recommend daily use of broad spectrum spf 30+ sunscreen to sun-exposed areas.  ? ?Hemangiomas ?- Red papules ?- Discussed benign nature ?- Observe ?- Call for any changes ? ?Actinic Damage ?- Chronic condition, secondary to cumulative UV/sun exposure ?- diffuse scaly erythematous macules with underlying dyspigmentation ?- Recommend daily broad spectrum sunscreen SPF 30+ to sun-exposed areas, reapply every 2 hours as needed.  ?- Staying in the shade or wearing long sleeves, sun glasses (UVA+UVB protection) and wide brim hats (4-inch brim around the entire circumference of the hat) are also recommended for sun protection.  ?- Call for new or changing lesions. ? ? Melanoma at Scalp 2022 ?and left upper arm currently treated at [facility] with IV nivo immunotherapy treatment  ?-  large dark blue gray patch with scattered dark macules at periphery on crown scalp  ?- No lymphadenopathy ?- Recommend regular full body skin exams ?- Recommend daily broad spectrum sunscreen SPF 30+ to sun-exposed areas, reapply every 2 hours as needed.  ?- Call if any new or changing lesions  are noted between office visits ? ?History of Melanoma 0.56m ?- No evidence of recurrence today L upper arm ?- treated with Nivo immunotherapy as above ?- Recommend regular full body skin exams ?- Recommend daily broad spectrum sunscreen SPF 30+ to sun-exposed areas, reapply every 2 hours as needed.  ?- Call if any new or changing lesions are noted between office visits  ? ?Skin cancer screening performed today. ?Return in about 3 months (around 04/07/2022) for  follow up on HS, ISKs, and melanoma . ?I, MRuthell Rummage CMA, am acting as scribe for TBrendolyn Patty MD. ? ?Documentation: I have  reviewed the above documentation for accuracy and completeness, and I agree with the above. ? ?TBrendolyn PattyMD  ? ?

## 2022-02-11 ENCOUNTER — Ambulatory Visit: Payer: Self-pay | Admitting: *Deleted

## 2022-02-11 NOTE — Telephone Encounter (Signed)
Summary: pt is feeling awful   Pt daughter is seeking clinical advice.   ----- Message from Corlis Hove sent at 02/11/2022  4:34 PM EDT -----  Pt daughter stated mother isn't feeling good. Pt can't explain or describe how she feels; it just doesn't feel good, she says it feels awful. Pt daughter is really worried.   Pt daughter bs is perfect. Pt just says she doesn't feel good. Is in treatment for scalp cancer melanoma. She's low on energy, but not three weeks later.        Chief Complaint: not feeling good per patient's daughter Symptoms: low energy, fatigue blood sugar 108. Frequency: today  Pertinent Negatives: Patient denies chest pain difficulty breathing no fever. No other sx  Disposition: '[]'$ ED /'[]'$ Urgent Care (no appt availability in office) / '[]'$ Appointment(In office/virtual)/ '[]'$  [place] [facility]/ '[x]'$ Home Care/ '[]'$ Refused Recommended Disposition /'[]'$ [place] [facility]/ '[]'$  Follow-up with PCP Additional Notes:   Appt scheduled for 02/13/22. Please advise if patient needs to be seen earlier.    Reason for Disposition  Mild weakness or fatigue with acute minor illness (e.g., colds)  Answer Assessment - Initial Assessment Questions 1. DESCRIPTION: "Describe how you are feeling."     "Not feeling good" 2. SEVERITY: "How bad is it?"  "Can you stand and walk?"   - MILD - Feels weak or tired, but does not interfere with work, school or normal activities   - [place] to stand and walk; weakness interferes with work, school, or normal activities   - SEVERE - Unable to stand or walk     Mild  3. ONSET:  "When did the weakness begin?"     Today  4. CAUSE: "What do you think is causing the weakness?"     Not sure  5. MEDICINES: "Have you recently started a new medicine or had a change in the amount of a medicine?"     No  6. OTHER SYMPTOMS: "Do you have any other symptoms?" (e.g., chest pain, fever, cough, SOB, vomiting, diarrhea, bleeding, other areas of pain)     No  fatigue  7. PREGNANCY: "Is there any chance you are pregnant?" "When was your last menstrual period?"     na  Protocols used: Weakness (Generalized) and Fatigue-A-AH

## 2022-02-11 NOTE — Progress Notes (Unsigned)
I,Sulibeya S Dimas,acting as a Education administrator for Lavon Paganini, MD.,have documented all relevant documentation on the behalf of Lavon Paganini, MD,as directed by  Lavon Paganini, MD while in the presence of Lavon Paganini, MD.   Established patient visit   Patient: Raven Lewis   DOB: 05-Jul-1935   87 y.o. Female  MRN: [id number] Visit Date: 02/13/2022  Today's healthcare provider: Lavon Paganini, MD   Chief Complaint  Patient presents with   Diabetes   Subjective    HPI  Diabetes Mellitus Type II, Follow-up  Lab Results  Component Value Date   HGBA1C 6.8 (A) 02/13/2022   HGBA1C 6.8 (A) 08/15/2021   HGBA1C 7.4 (H) 01/28/2021   Wt Readings from Last 3 Encounters:  02/13/22 169 lb (76.7 kg)  08/15/21 168 lb 4.8 oz (76.3 kg)  07/21/21 172 lb (78 kg)   Last seen for diabetes 6 months ago.  Management since then includes continue current meds. She reports excellent compliance with treatment. She is not having side effects.    Symptoms: Yes fatigue No foot ulcerations  No appetite changes No nausea  No paresthesia of the feet  No polydipsia  No polyuria No visual disturbances   No vomiting     Home blood sugar records: fasting range: 150s  Episodes of hypoglycemia? No    Current insulin regiment: none Most Recent Eye Exam: 02/2921 Current exercise: walking Current diet habits: in general, a "healthy" diet    Pertinent Labs: Lab Results  Component Value Date   CHOL 178 01/28/2021   HDL 57 01/28/2021   LDLCALC 82 01/28/2021   TRIG 237 (H) 01/28/2021   CHOLHDL 3.1 01/28/2021   Lab Results  Component Value Date   NA 142 01/28/2021   K 4.2 01/28/2021   CREATININE 0.75 01/28/2021   EGFR 79 01/28/2021   LABMICR 24.8 08/15/2021      Had episode of gas pain in abdomen 2 days ago. Has resolved. She feels better if she eats a whopper from [employer].  Has not had BM 2 days. Not taking any thing as gasx seemed to make it worse 2 days ago.  Has  thought in the past that she may be lactose intolerant and did have some milk 4 days ago.  Last treatment for melanoma later this month. Having scans again in July. ---------------------------------------------------------------------------------------------------   Medications: Outpatient Medications Prior to Visit  Medication Sig   Blood Glucose Monitoring Suppl (FREESTYLE LITE) w/Device KIT USE AS DIRECTED   cholecalciferol (VITAMIN D3) 25 MCG (1000 UNIT) tablet Take 1,000 Units by mouth daily.   clindamycin (CLEOCIN T) 1 % lotion APPLY TOPICALLY TO THE AFFECTED AREA  of groin daily after shower. Can also use for pimples at face   FREESTYLE TEST STRIPS test strip TEST TID UTD   ketoconazole (NIZORAL) 2 % cream Apply 1 application. topically See admin instructions. Use 1 - 2 times daily to aa's at inner thighs for rash   Lancets (FREESTYLE) lancets USE TID   Loratadine 10 MG CAPS Take 10 mg by mouth daily as needed.   metFORMIN (GLUCOPHAGE-XR) 500 MG 24 hr tablet Please schedule office visit before any future refill.   Multiple Vitamin (MULTIVITAMIN) tablet Take 1 tablet by mouth daily.   rosuvastatin (CRESTOR) 10 MG tablet Take 10 mg by mouth daily.   Semaglutide, 1 MG/DOSE, (OZEMPIC, 1 MG/DOSE,) 4 MG/3ML SOPN INJECT 1MG SUBCUTANEOUSLY  ONCE A WEEK   zolpidem (AMBIEN) 5 MG tablet Take 1 tablet (5 mg  total) by mouth at bedtime as needed. Do not fill <30 days from last refill   [DISCONTINUED] mupirocin ointment (BACTROBAN) 2 % APPLY TOPICALLY TO THE AFFECTED AREA DAILY WITH DRESSING CHANGES (Patient not taking: Reported on 01/06/2022)   No facility-administered medications prior to visit.    Review of Systems  Constitutional:  Positive for fatigue.  Per HPI     Objective    BP 131/77 (BP Location: Left Arm, Patient Position: Sitting, Cuff Size: Large)   Pulse 67   Temp (!) 97.5 F (36.4 C) (Oral)   Resp 16   Wt 169 lb (76.7 kg)   BMI 26.47 kg/m  BP Readings from Last 3  Encounters:  02/13/22 131/77  08/15/21 134/83  07/21/21 (!) 150/94   Wt Readings from Last 3 Encounters:  02/13/22 169 lb (76.7 kg)  08/15/21 168 lb 4.8 oz (76.3 kg)  07/21/21 172 lb (78 kg)      Physical Exam Vitals reviewed.  Constitutional:      General: She is not in acute distress.    Appearance: Normal appearance. She is well-developed. She is not diaphoretic.  HENT:     Head: Normocephalic and atraumatic.  Eyes:     General: No scleral icterus.    Conjunctiva/sclera: Conjunctivae normal.  Neck:     Thyroid: No thyromegaly.  Cardiovascular:     Rate and Rhythm: Normal rate and regular rhythm.     Heart sounds: Normal heart sounds. No murmur heard. Pulmonary:     Effort: Pulmonary effort is normal. No respiratory distress.     Breath sounds: Normal breath sounds. No wheezing, rhonchi or rales.  Musculoskeletal:     Cervical back: Neck supple.     Right lower leg: No edema.     Left lower leg: No edema.  Lymphadenopathy:     Cervical: No cervical adenopathy.  Skin:    General: Skin is warm and dry.  Neurological:     Mental Status: She is alert and oriented to person, place, and time. Mental status is at baseline.  Psychiatric:        Mood and Affect: Mood normal.        Behavior: Behavior normal.     Diabetic Foot Exam - Simple   Simple Foot Form Diabetic Foot exam was performed with the following findings: Yes 02/13/2022 11:27 AM  Visual Inspection No deformities, no ulcerations, no other skin breakdown bilaterally: Yes Sensation Testing Intact to touch and monofilament testing bilaterally: Yes Pulse Check Posterior Tibialis and Dorsalis pulse intact bilaterally: Yes Comments      Results for orders placed or performed in visit on 02/13/22  POCT glycosylated hemoglobin (Hb A1C)  Result Value Ref Range   Hemoglobin A1C 6.8 (A) 4.0 - 5.6 %    Assessment & Plan     Problem List Items Addressed This Visit       Endocrine   T2DM (type 2 diabetes  mellitus) ([place]) - Primary    Well controlled with A1c 6.8 Continue current medications UTD on vaccines, eye exam Foot exam and UACR today On Statin Discussed diet and exercise F/u in 6 months       Relevant Medications   rosuvastatin (CRESTOR) 10 MG tablet   Other Relevant Orders   POCT glycosylated hemoglobin (Hb A1C) (Completed)   Urine Microalbumin w/creat. ratio   Lipid panel   Microalbuminuria due to type 2 diabetes mellitus ([place])    Reported lethargy on lisinopril - no longer taking Continue to  monitor UACR       Relevant Medications   rosuvastatin (CRESTOR) 10 MG tablet   Other Relevant Orders   Urine Microalbumin w/creat. ratio     Other   Overweight    Discussed importance of healthy weight management Discussed diet and exercise       Abdominal bloating    Episode 2 days ago - now resolved May have been secondary to dairy intake and likely lactose intolerance Lewis try miralax if no BM today         Return in about 6 months (around 08/15/2022) for chronic disease f/u.      I, Lavon Paganini, MD, have reviewed all documentation for this visit. The documentation on 02/13/22 for the exam, diagnosis, procedures, and orders are all accurate and complete.   Bacigalupo, Dionne Bucy, MD, MPH [facility]

## 2022-02-13 ENCOUNTER — Ambulatory Visit (INDEPENDENT_AMBULATORY_CARE_PROVIDER_SITE_OTHER): Payer: Medicare Other | Admitting: Family Medicine

## 2022-02-13 ENCOUNTER — Encounter: Payer: Self-pay | Admitting: Family Medicine

## 2022-02-13 ENCOUNTER — Other Ambulatory Visit: Payer: Self-pay | Admitting: Family Medicine

## 2022-02-13 VITALS — BP 131/77 | HR 67 | Temp 97.5°F | Resp 16 | Wt 169.0 lb

## 2022-02-13 DIAGNOSIS — R140 Abdominal distension (gaseous): Secondary | ICD-10-CM | POA: Diagnosis not present

## 2022-02-13 DIAGNOSIS — R809 Proteinuria, unspecified: Secondary | ICD-10-CM

## 2022-02-13 DIAGNOSIS — E663 Overweight: Secondary | ICD-10-CM | POA: Diagnosis not present

## 2022-02-13 DIAGNOSIS — E1129 Type 2 diabetes mellitus with other diabetic kidney complication: Secondary | ICD-10-CM | POA: Diagnosis not present

## 2022-02-13 LAB — POCT GLYCOSYLATED HEMOGLOBIN (HGB A1C): Hemoglobin A1C: 6.8 % — AB (ref 4.0–5.6)

## 2022-02-13 NOTE — Assessment & Plan Note (Signed)
Discussed importance of healthy weight management Discussed diet and exercise  

## 2022-02-13 NOTE — Assessment & Plan Note (Signed)
Episode 2 days ago - now resolved May have been secondary to dairy intake and likely lactose intolerance Will try miralax if no BM today

## 2022-02-13 NOTE — Assessment & Plan Note (Signed)
Reported lethargy on lisinopril - no longer taking Continue to monitor UACR

## 2022-02-13 NOTE — Telephone Encounter (Signed)
[facility] faxed refill request for the following medications:   zolpidem (AMBIEN) 5 MG tablet     Please advise.  

## 2022-02-13 NOTE — Assessment & Plan Note (Signed)
Well controlled with A1c 6.8 Continue current medications UTD on vaccines, eye exam Foot exam and UACR today On Statin Discussed diet and exercise F/u in 6 months

## 2022-02-15 LAB — LIPID PANEL
Chol/HDL Ratio: 1.8 ratio (ref 0.0–4.4)
Cholesterol, Total: 115 mg/dL (ref 100–199)
HDL: 65 mg/dL (ref >39)
LDL Chol Calc (NIH): 28 mg/dL (ref 0–99)
Triglycerides: 124 mg/dL (ref 0–149)
VLDL Cholesterol Cal: 22 mg/dL (ref 5–40)

## 2022-02-15 LAB — MICROALBUMIN / CREATININE URINE RATIO
Creatinine, Urine: 70.3 mg/dL
Microalb/Creat Ratio: 16 mg/g creat (ref 0–29)
Microalbumin, Urine: 10.9 ug/mL

## 2022-02-16 MED ORDER — ZOLPIDEM TARTRATE 5 MG PO TABS: 5.0000 mg | ORAL_TABLET | Freq: Every evening | ORAL | 5 refills | Status: DC | PRN

## 2022-03-17 ENCOUNTER — Other Ambulatory Visit: Payer: Self-pay | Admitting: Family Medicine

## 2022-04-02 IMAGING — MG MM DIGITAL SCREENING BILAT W/ TOMO AND CAD
8 series · 8 of 24 positions shown · non-contrast
Comparison: Previous exam(s).

CLINICAL DATA: Screening.

EXAM:
DIGITAL SCREENING BILATERAL MAMMOGRAM WITH TOMOSYNTHESIS AND CAD
TECHNIQUE: Bilateral screening digital craniocaudal and mediolateral oblique
mammograms were obtained. Bilateral screening digital breast
tomosynthesis was performed. The images were evaluated with
computer-aided detection.

[L MLO synth-2D]
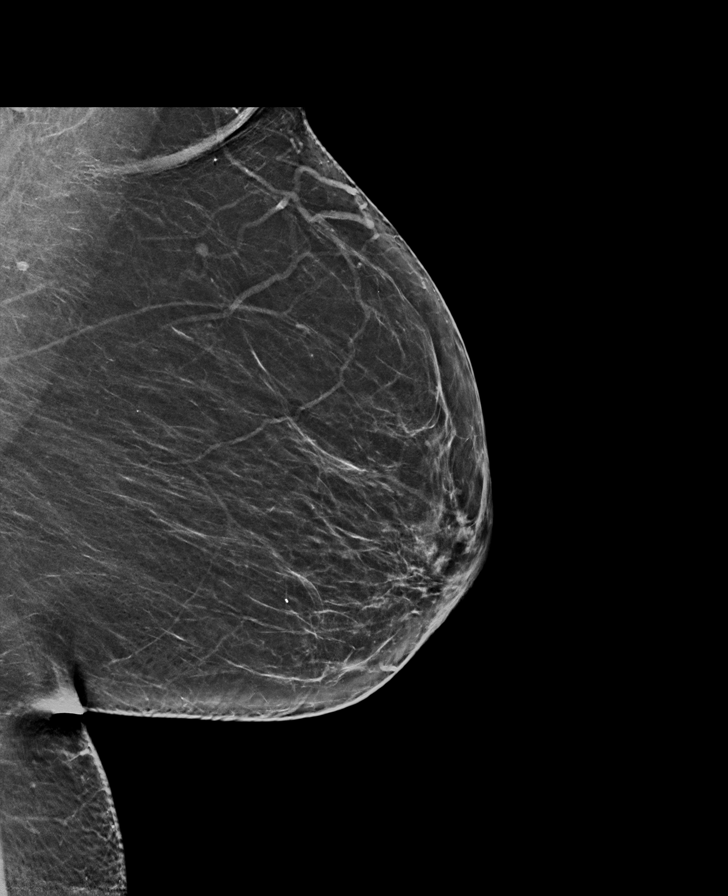

[R CC synth-2D]
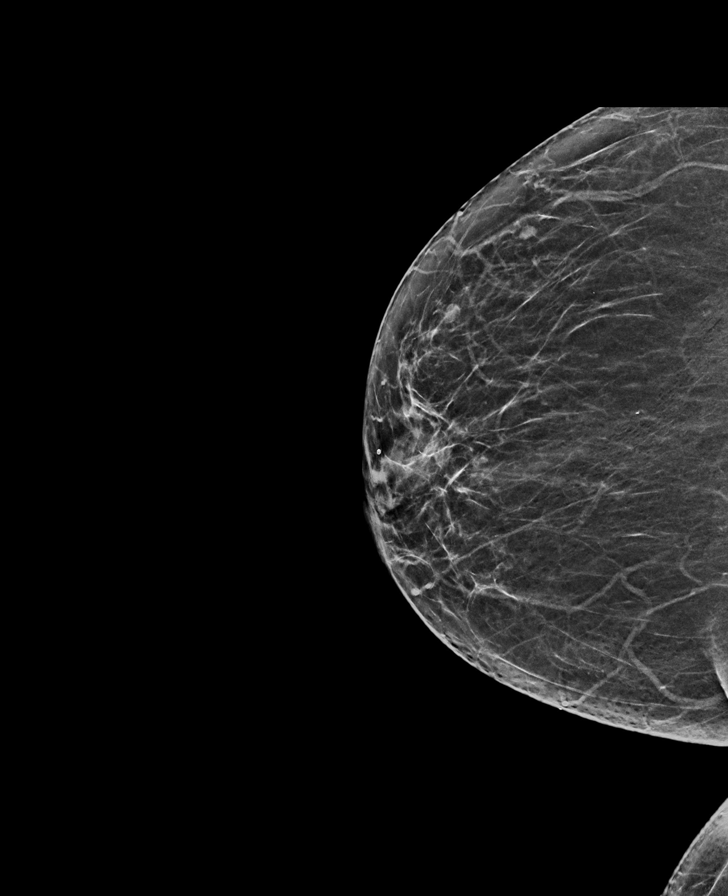

[R MLO synth-2D]
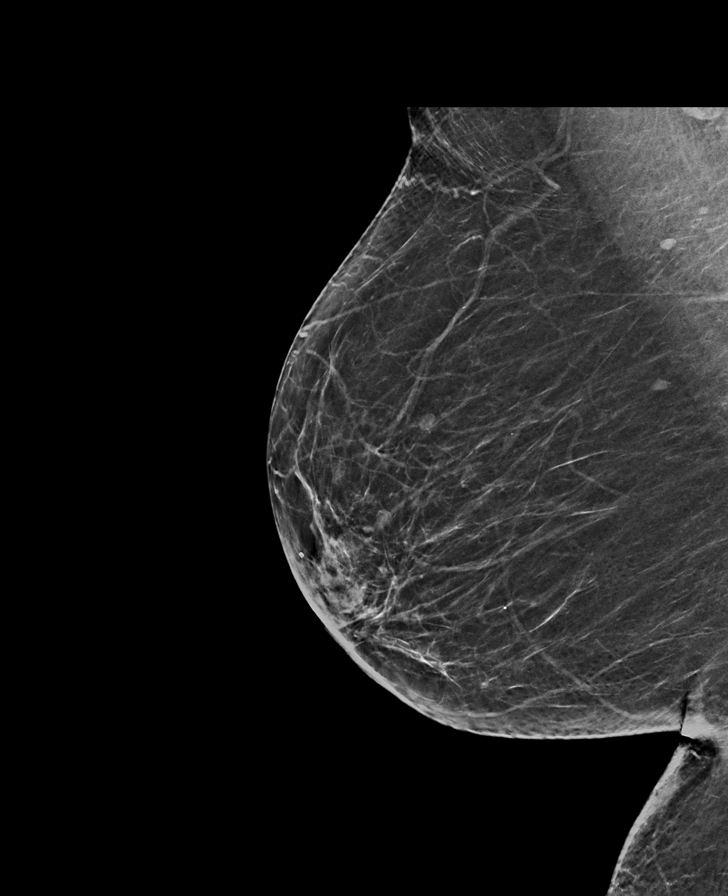

[L CC synth-2D]
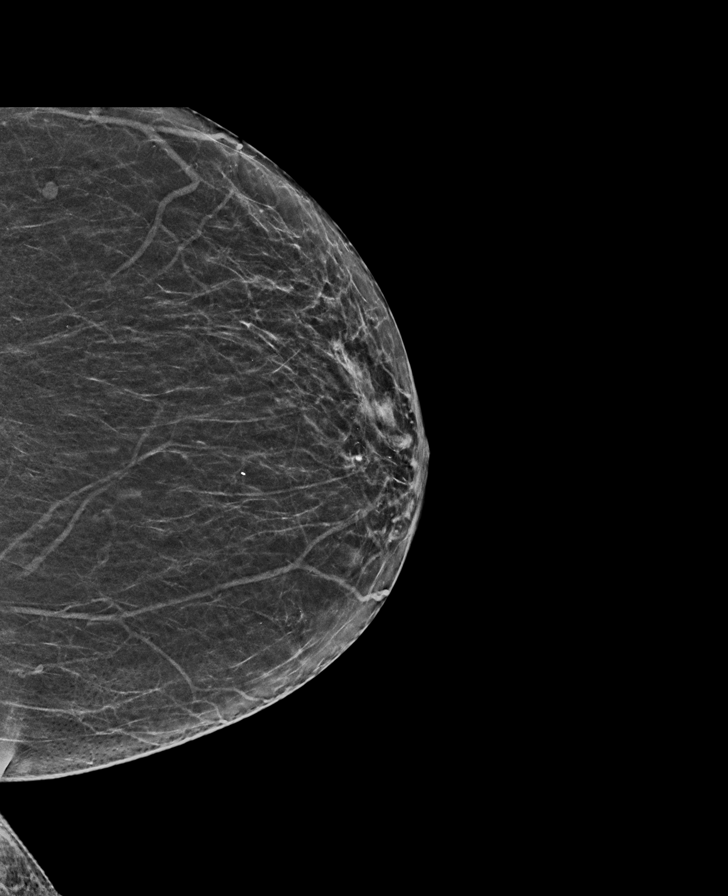

[L CC tomo · tomo slice 29/56.0]
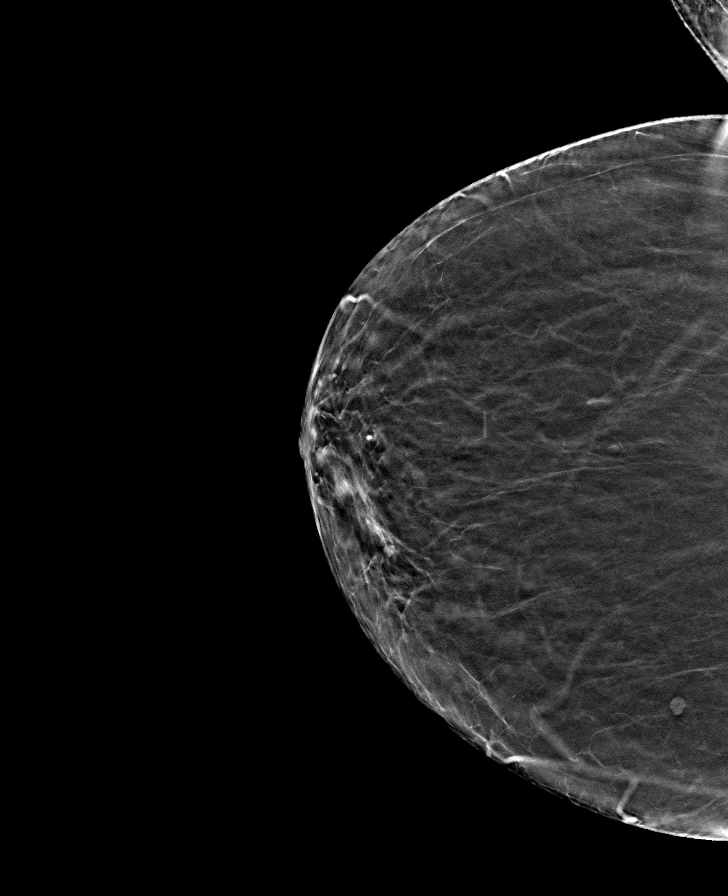

[R MLO tomo · tomo slice 32/63.0]
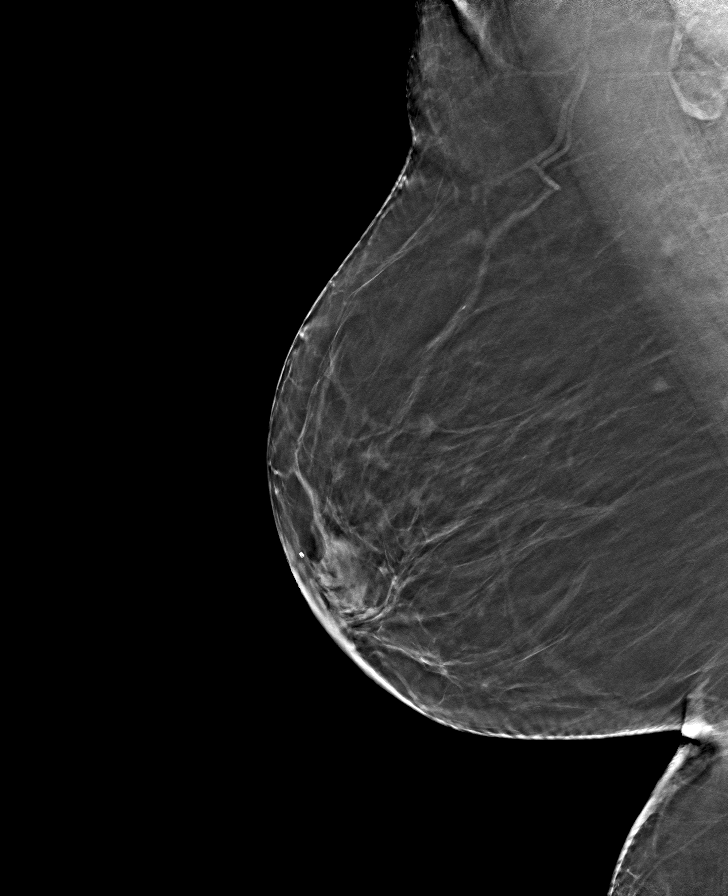

[R CC tomo · tomo slice 30/59.0]
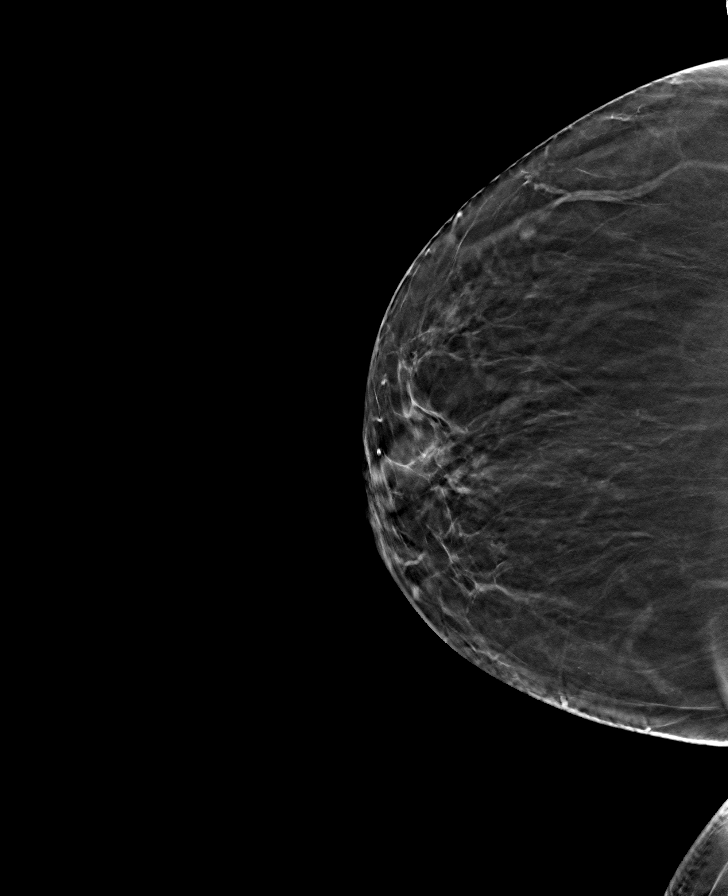

[L MLO tomo · tomo slice 34/67.0]
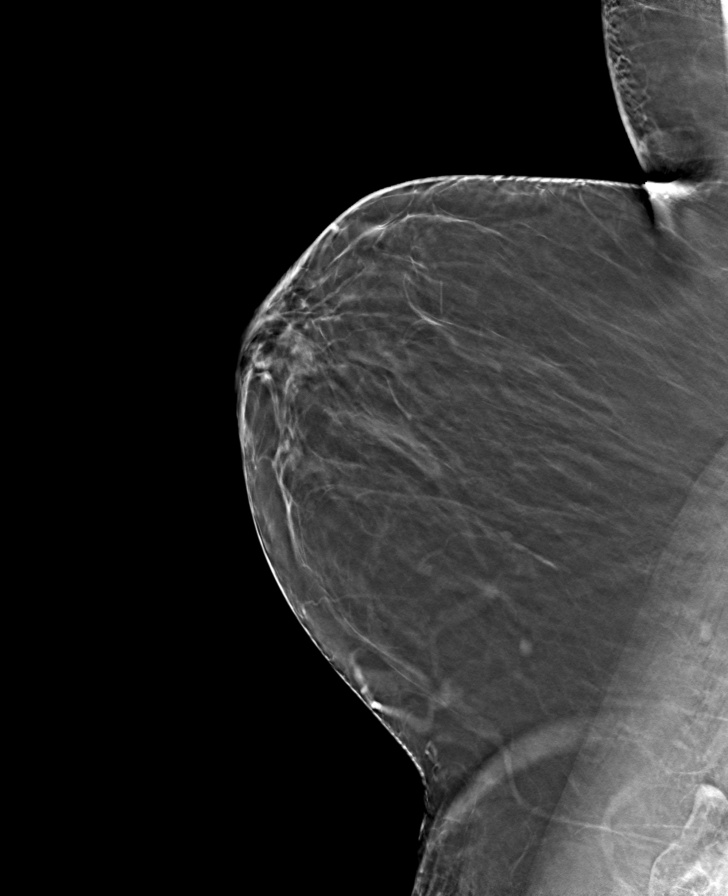

[8 of 24 positions shown; findings below may reference images not displayed]

ACR Breast Density Category b: There are scattered areas of
fibroglandular density.
FINDINGS: There are no findings suspicious for malignancy.
IMPRESSION: No mammographic evidence of malignancy. A result letter of this
screening mammogram will be mailed directly to the patient.

RECOMMENDATION:
Screening mammogram in one year. (Code:51-O-LD2)

BI-RADS CATEGORY  1: Negative.

## 2022-04-07 ENCOUNTER — Ambulatory Visit (INDEPENDENT_AMBULATORY_CARE_PROVIDER_SITE_OTHER): Payer: Medicare Other | Admitting: Dermatology

## 2022-04-07 DIAGNOSIS — L820 Inflamed seborrheic keratosis: Secondary | ICD-10-CM

## 2022-04-07 DIAGNOSIS — Z85820 Personal history of malignant melanoma of skin: Secondary | ICD-10-CM

## 2022-04-07 DIAGNOSIS — C434 Malignant melanoma of scalp and neck: Secondary | ICD-10-CM | POA: Diagnosis not present

## 2022-04-07 DIAGNOSIS — C439 Malignant melanoma of skin, unspecified: Secondary | ICD-10-CM

## 2022-04-07 NOTE — Progress Notes (Unsigned)
   Follow-Up Visit   Subjective  Raven Lewis is a 87 y.o. female who presents for the following: Follow-up (Patient here today for melanoma, ISK and HS follow up. Patient advises ISK's did well with LN2. Patient not using anything for HS, advises she has cleared. ).  ***  The following portions of the chart were reviewed this encounter and updated as appropriate:       Review of Systems:  No other skin or systemic complaints except as noted in HPI or Assessment and Plan.  Objective  Well appearing patient in no apparent distress; mood and affect are within normal limits.  {LFYB:01751::"W full examination was performed including scalp, head, eyes, ears, nose, lips, neck, chest, axillae, abdomen, back, buttocks, bilateral upper extremities, bilateral lower extremities, hands, feet, fingers, toes, fingernails, and toenails. All findings within normal limits unless otherwise noted below."}  residual at left wrist x 1 Erythematous stuck-on, waxy papule or plaque  Scalp Violaceous patch at crown       Assessment & Plan  Inflamed seborrheic keratosis residual at left wrist x 1  Symptomatic, irritating, patient would like treated.   Destruction of lesion - residual at left wrist x 1  Destruction method: cryotherapy   Informed consent: discussed and consent obtained   Lesion destroyed using liquid nitrogen: Yes   Region frozen until ice ball extended beyond lesion: Yes   Outcome: patient tolerated procedure well with no complications   Post-procedure details: wound care instructions given   Additional details:  Prior to procedure, discussed risks of blister formation, small wound, skin dyspigmentation, or rare scar following cryotherapy. Recommend Vaseline ointment to treated areas while healing.   Melanoma of skin ([place]) Scalp  Recent bx done showing recurrent melanoma at scalp  Patient being treated with T-VEC and  immunotherapy at [facility]   History of Melanoma - No  evidence of recurrence today at left upper arm  - Treated with Nivo immunotherapy - No lymphadenopathy - Recommend regular full body skin exams - Recommend daily broad spectrum sunscreen SPF 30+ to sun-exposed areas, reapply every 2 hours as needed.  - Call if any new or changing lesions are noted between office visits   Return in about 3 months (around 07/08/2022) for TBSE.  Graciella Belton, RMA, am acting as scribe for Brendolyn Patty, MD .

## 2022-04-07 NOTE — Patient Instructions (Signed)
Cryotherapy Aftercare  Wash gently with soap and water everyday.   Apply Vaseline and Band-Aid daily until healed.   Melanoma ABCDEs  Melanoma is the most dangerous type of skin cancer, and is the leading cause of death from skin disease.  You are more likely to develop melanoma if you: Have light-colored skin, light-colored eyes, or red or blond hair Spend a lot of time in the sun Tan regularly, either outdoors or in a tanning bed Have had blistering sunburns, especially during childhood Have a close family member who has had a melanoma Have atypical moles or large birthmarks  Early detection of melanoma is key since treatment is typically straightforward and cure rates are extremely high if we catch it early.   The first sign of melanoma is often a change in a mole or a new dark spot.  The ABCDE system is a way of remembering the signs of melanoma.  A for asymmetry:  The two halves do not match. B for border:  The edges of the growth are irregular. C for color:  A mixture of colors are present instead of an even brown color. D for diameter:  Melanomas are usually (but not always) greater than 6mm - the size of a pencil eraser. E for evolution:  The spot keeps changing in size, shape, and color.  Please check your skin once per month between visits. You can use a small mirror in front and a large mirror behind you to keep an eye on the back side or your body.   If you see any new or changing lesions before your next follow-up, please call to schedule a visit.  Please continue daily skin protection including broad spectrum sunscreen SPF 30+ to sun-exposed areas, reapplying every 2 hours as needed when you're outdoors.     Due to recent changes in healthcare laws, you may see results of your pathology and/or laboratory studies on MyChart before the doctors have had a chance to review them. We understand that in some cases there may be results that are confusing or concerning to you.  Please understand that not all results are received at the same time and often the doctors may need to interpret multiple results in order to provide you with the best plan of care or course of treatment. Therefore, we ask that you please give us 2 business days to thoroughly review all your results before contacting the office for clarification. Should we see a critical lab result, you will be contacted sooner.   If You Need Anything After Your Visit  If you have any questions or concerns for your doctor, please call our main line at [phone] and press option 4 to reach your doctor's medical assistant. If no one answers, please leave a voicemail as directed and we will return your call as soon as possible. Messages left after 4 pm will be answered the following business day.   You may also send us a message via MyChart. We typically respond to MyChart messages within 1-2 business days.  For prescription refills, please ask your pharmacy to contact our office. Our fax number is [phone].  If you have an urgent issue when the clinic is closed that cannot wait until the next business day, you can page your doctor at the number below.    Please note that while we do our best to be available for urgent issues outside of office hours, we are not available 24/7.   If you have an urgent   issue and are unable to reach us, you may choose to seek medical care at your doctor's office, retail clinic, urgent care center, or emergency room.  If you have a medical emergency, please immediately call 911 or go to the emergency department.  Pager Numbers  - Dr. Kowalski: [phone]  - Dr. Moye: [phone]  - Dr. Stewart: [phone]  In the event of inclement weather, please call our main line at [phone] for an update on the status of any delays or closures.  Dermatology Medication Tips: Please keep the boxes that topical medications come in in order to help keep track of the  instructions about where and how to use these. Pharmacies typically print the medication instructions only on the boxes and not directly on the medication tubes.   If your medication is too expensive, please contact our office at [phone] option 4 or send us a message through MyChart.   We are unable to tell what your co-pay for medications will be in advance as this is different depending on your insurance coverage. However, we may be able to find a substitute medication at lower cost or fill out paperwork to get insurance to cover a needed medication.   If a prior authorization is required to get your medication covered by your insurance company, please allow us 1-2 business days to complete this process.  Drug prices often vary depending on where the prescription is filled and some pharmacies may offer cheaper prices.  The website www.goodrx.com contains coupons for medications through different pharmacies. The prices here do not account for what the cost may be with help from insurance (it may be cheaper with your insurance), but the website can give you the price if you did not use any insurance.  - You can print the associated coupon and take it with your prescription to the pharmacy.  - You may also stop by our office during regular business hours and pick up a GoodRx coupon card.  - If you need your prescription sent electronically to a different pharmacy, notify our office through [facility] MyChart or by phone at [phone] option 4.     Si Usted Necesita Algo Despus de Su Visita  Tambin puede enviarnos un mensaje a travs de MyChart. Por lo general respondemos a los mensajes de MyChart en el transcurso de 1 a 2 das hbiles.  Para renovar recetas, por favor pida a su farmacia que se ponga en contacto con nuestra oficina. Nuestro nmero de fax es el [phone].  Si tiene un asunto urgente cuando la clnica est cerrada y que no puede esperar hasta el siguiente da hbil,  puede llamar/localizar a su doctor(a) al nmero que aparece a continuacin.   Por favor, tenga en cuenta que aunque hacemos todo lo posible para estar disponibles para asuntos urgentes fuera del horario de oficina, no estamos disponibles las 24 horas del da, los 7 das de la semana.   Si tiene un problema urgente y no puede comunicarse con nosotros, puede optar por buscar atencin mdica  en el consultorio de su doctor(a), en una clnica privada, en un centro de atencin urgente o en una sala de emergencias.  Si tiene una emergencia mdica, por favor llame inmediatamente al 911 o vaya a la sala de emergencias.  Nmeros de bper  - Dr. Kowalski: [phone]  - Dra. Moye: [phone]  - Dra. Stewart: [phone]  En caso de inclemencias del tiempo, por favor llame a nuestra lnea principal al [phone] para una actualizacin   sobre el estado de cualquier retraso o cierre.  Consejos para la medicacin en dermatologa: Por favor, guarde las cajas en las que vienen los medicamentos de uso tpico para ayudarle a seguir las instrucciones sobre dnde y cmo usarlos. Las farmacias generalmente imprimen las instrucciones del medicamento slo en las cajas y no directamente en los tubos del medicamento.   Si su medicamento es muy caro, por favor, pngase en contacto con nuestra oficina llamando al [phone] y presione la opcin 4 o envenos un mensaje a travs de MyChart.   No podemos decirle cul ser su copago por los medicamentos por adelantado ya que esto es diferente dependiendo de la cobertura de su seguro. Sin embargo, es posible que podamos encontrar un medicamento sustituto a menor costo o llenar un formulario para que el seguro cubra el medicamento que se considera necesario.   Si se requiere una autorizacin previa para que su compaa de seguros cubra su medicamento, por favor permtanos de 1 a 2 das hbiles para completar este proceso.  Los precios de los medicamentos varan con  frecuencia dependiendo del lugar de dnde se surte la receta y alguna farmacias pueden ofrecer precios ms baratos.  El sitio web www.goodrx.com tiene cupones para medicamentos de diferentes farmacias. Los precios aqu no tienen en cuenta lo que podra costar con la ayuda del seguro (puede ser ms barato con su seguro), pero el sitio web puede darle el precio si no utiliz ningn seguro.  - Puede imprimir el cupn correspondiente y llevarlo con su receta a la farmacia.  - Tambin puede pasar por nuestra oficina durante el horario de atencin regular y recoger una tarjeta de cupones de GoodRx.  - Si necesita que su receta se enve electrnicamente a una farmacia diferente, informe a nuestra oficina a travs de MyChart de [facility] o por telfono llamando al [phone] y presione la opcin 4.  

## 2022-04-20 ENCOUNTER — Telehealth: Payer: Self-pay | Admitting: Family Medicine

## 2022-04-20 MED ORDER — OZEMPIC (1 MG/DOSE) 4 MG/3ML ~~LOC~~ SOPN: PEN_INJECTOR | SUBCUTANEOUS | 1 refills | Status: DC

## 2022-04-20 NOTE — Addendum Note (Signed)
Addended by: Barnie Mort on: 04/20/2022 11:24 AM   Modules accepted: Orders

## 2022-04-20 NOTE — Telephone Encounter (Signed)
[facility] faxed refill request for the following medications:   Semaglutide, 1 MG/DOSE, (OZEMPIC, 1 MG/DOSE,) 4 MG/3ML SOPN   Please advise.

## 2022-05-25 DIAGNOSIS — R5383 Other fatigue: Secondary | ICD-10-CM | POA: Insufficient documentation

## 2022-06-23 ENCOUNTER — Other Ambulatory Visit: Payer: Self-pay | Admitting: Family Medicine

## 2022-06-23 DIAGNOSIS — Z1231 Encounter for screening mammogram for malignant neoplasm of breast: Secondary | ICD-10-CM

## 2022-07-07 LAB — HM DIABETES EYE EXAM

## 2022-07-14 ENCOUNTER — Ambulatory Visit: Payer: Medicare Other | Admitting: Dermatology

## 2022-07-15 ENCOUNTER — Ambulatory Visit: Payer: Self-pay | Admitting: *Deleted

## 2022-07-15 ENCOUNTER — Ambulatory Visit (INDEPENDENT_AMBULATORY_CARE_PROVIDER_SITE_OTHER): Payer: Medicare Other | Admitting: Family Medicine

## 2022-07-15 ENCOUNTER — Ambulatory Visit: Payer: Medicare Other | Admitting: Dermatology

## 2022-07-15 ENCOUNTER — Encounter: Payer: Self-pay | Admitting: Family Medicine

## 2022-07-15 DIAGNOSIS — R110 Nausea: Secondary | ICD-10-CM | POA: Diagnosis not present

## 2022-07-15 MED ORDER — ONDANSETRON 4 MG PO TBDP: 4.0000 mg | ORAL_TABLET | Freq: Three times a day (TID) | ORAL | 0 refills | Status: DC | PRN

## 2022-07-15 MED ORDER — OMEPRAZOLE 20 MG PO CPDR: 20.0000 mg | DELAYED_RELEASE_CAPSULE | Freq: Every day | ORAL | 0 refills | Status: DC

## 2022-07-15 NOTE — Patient Instructions (Addendum)
It was great to see you!  Our plans for today:  - Take the omeprazole every day. - Take the zofran (ondansetron) as needed for nausea.  - Let your oncologist know of your symptoms.  - If your symptoms change or worsen, let us know. If your symptoms are not improving by the weekend, let us know.   Take care and seek immediate care sooner if you develop any concerns.   Dr. Ky Barban

## 2022-07-15 NOTE — Progress Notes (Signed)
   SUBJECTIVE:   CHIEF COMPLAINT / HPI:   ABDOMINAL ISSUES - h/o malignant melanoma with nausea after previous cycle of nivolumab and TVEC. Follows with [facility]. 06/22/22  PET showed by evidence of metastatic disease. Last infusion 06/23/22. - nausea for the past 10 days, intermittent. Has vomited twice. Last vomit episode few days ago. - hasn't eaten yet today, currently nauseous.  - on ozempic for diabetes with no recent change in dose.  - nausea occurs more in evening, most nights - eating and drinking ok - some fatigue  Duration: 10 days, intermittent Nature: no pain Frequency: intermittent Alleviating factors: none Aggravating factors: none Treatments attempted: took one pill for nausea few days ago, unsure of name, vomited it up. URI symptoms: no Constipation: no Diarrhea: no Heartburn: no, does experience some chest discomfort with nausea. Not related to exertion, no SOB.  Nausea: yes Vomiting: yes, 2 episodes total. Last episode Sunday. Melena or hematochezia: no Fever: no   OBJECTIVE:   BP 106/65 (BP Location: Left Arm, Patient Position: Sitting, Cuff Size: Large)   Pulse 84   Temp (!) 97.5 F (36.4 C) (Oral)   Resp 16   Wt 164 lb 6.4 oz (74.6 kg)   BMI 25.75 kg/m   Gen: well appearing, in NAD Card: RRR Lungs: CTAB Abd: soft, NTND, hypoactive BS.  Ext: WWP, no edema   ASSESSMENT/PLAN:   Nausea Acute problem, unclear etiology. Per chart review does have h/o nausea with previous cycles of chemo with previous pause in treatment due to symptoms although patient does not recall this. Last infusion 3 weeks ago and with lack of other infectious symptoms and normal exam today, may be contributing. Has not been eating as much, symptoms worse in evenings and with some chest discomfort, may have some acid reflux as well. No other symptoms to concern for cardiac/respiratory etiology. Will trial PPI with prn zofran and encouraged adequate nutrition and hydration.  Instructed to keep oncology informed of symptoms. RTC if no improvement by next week.      Myles Gip, DO

## 2022-07-15 NOTE — Telephone Encounter (Signed)
  Chief Complaint: vomiting Symptoms: nausea, vomiting Frequency: 2 episodes- Sunday and today Pertinent Negatives: Patient denies vomiting blood or coffee grounds, recent head injury Disposition: '[]'$ ED /'[]'$ Urgent Care (no appt availability in office) / '[x]'$ Appointment(In office/virtual)/ '[]'$  [facility]/ '[]'$ Home Care/ '[]'$ Refused Recommended Disposition /'[]'$ [facility]/ '[]'$  Follow-up with PCP Additional Notes:

## 2022-07-15 NOTE — Assessment & Plan Note (Signed)
Acute problem, unclear etiology. Per chart review does have h/o nausea with previous cycles of chemo with previous pause in treatment due to symptoms although patient does not recall this. Last infusion 3 weeks ago and with lack of other infectious symptoms and normal exam today, may be contributing. Has not been eating as much, symptoms worse in evenings and with some chest discomfort, may have some acid reflux as well. No other symptoms to concern for cardiac/respiratory etiology. Will trial PPI with prn zofran and encouraged adequate nutrition and hydration. Instructed to keep oncology informed of symptoms. RTC if no improvement by next week.

## 2022-07-15 NOTE — Telephone Encounter (Signed)
Summary: nausea and vomiting   Pt got nauseous last night with vomiting / pt stated she was finally able to go to sleep and 5 am / she stated this happened on Sunday as well/ please advise      Reason for Disposition  [1] MILD or MODERATE vomiting AND [2] present > 48 hours (2 days) (Exception: Mild vomiting with associated diarrhea.)  Answer Assessment - Initial Assessment Questions 1. VOMITING SEVERITY: "How many times have you vomited in the past 24 hours?"     - MILD:  1 - 2 times/day    - MODERATE: 3 - 5 times/day, decreased oral intake without significant weight loss or symptoms of dehydration    - SEVERE: 6 or more times/day, vomits everything or nearly everything, with significant weight loss, symptoms of dehydration      moderate 2. ONSET: "When did the vomiting begin?"      Sunday 3. FLUIDS: "What fluids or food have you vomited up today?" "Have you been able to keep any fluids down?"     Patient has not tried to eat or drink yet 4. ABDOMEN PAIN: "Are your having any abdomen pain?" If Yes : "How bad is it and what does it feel like?" (e.g., crampy, dull, intermittent, constant)      Soreness from vomiting 5. DIARRHEA: "Is there any diarrhea?" If Yes, ask: "How many times today?"      no 6. CONTACTS: "Is there anyone else in the family with the same symptoms?"      no 7. CAUSE: "What do you think is causing your vomiting?"     unsure 8. HYDRATION STATUS: "Any signs of dehydration?" (e.g., dry mouth [not only dry lips], too weak to stand) "When did you last urinate?"     no 9. OTHER SYMPTOMS: "Do you have any other symptoms?" (e.g., fever, headache, vertigo, vomiting blood or coffee grounds, recent head injury)     Nausea hours after eating  Protocols used: Vomiting-A-AH

## 2022-07-29 ENCOUNTER — Ambulatory Visit
Admission: RE | Admit: 2022-07-29 | Discharge: 2022-07-29 | Disposition: A | Discharge status: Home / Self Care | Payer: Medicare Other | Source: Ambulatory Visit | Attending: Family Medicine | Admitting: Family Medicine

## 2022-07-29 DIAGNOSIS — Z1231 Encounter for screening mammogram for malignant neoplasm of breast: Secondary | ICD-10-CM | POA: Insufficient documentation

## 2022-07-30 ENCOUNTER — Telehealth: Payer: Self-pay

## 2022-07-30 NOTE — Telephone Encounter (Signed)
Patient called today regarding scheduling a follow up appointment. Patient was booked out in July but needed to be seen sooner due to melanoma regimen. Patient's daughter called upset that we are that far booked out.  I did call the patient this afternoon and discussed scheduling a sooner appt on Dec 12th. Patient declines and states she may never come back here. She does not feel like we care and feels uncomfortable coming to our practice. I did explain to the patient the front staff does not have access to clinical data, nor the knowledge to when someone needs to be seen within a timely manner.   Patient declined scheduling in December and wants to hold her July appointment until talking to her daughter.

## 2022-08-13 ENCOUNTER — Telehealth: Payer: Self-pay | Admitting: Family Medicine

## 2022-08-13 NOTE — Telephone Encounter (Signed)
[facility] faxed refill request for the following medications:   zolpidem (AMBIEN) 5 MG tablet     Please advise.

## 2022-08-14 MED ORDER — ZOLPIDEM TARTRATE 5 MG PO TABS: 5.0000 mg | ORAL_TABLET | Freq: Every evening | ORAL | 0 refills | Status: DC | PRN

## 2022-08-14 NOTE — Progress Notes (Unsigned)
I,Sha'taria Myiesha Edgar,acting as a Education administrator for Myles Gip, DO.,have documented all relevant documentation on the behalf of Myles Gip, DO,as directed by  Myles Gip, DO while in the presence of Myles Gip, DO.   Established patient visit   Patient: Raven Lewis   DOB: April 06, 1935   87 y.o. Female  MRN: [id number] Visit Date: 08/17/2022  Today's healthcare provider: Myles Gip, DO   No chief complaint on file.  Subjective    HPI  Diabetes Mellitus Type II, Follow-up  Lab Results  Component Value Date   HGBA1C 6.8 (A) 08/17/2022   HGBA1C 6.8 (A) 02/13/2022   HGBA1C 6.8 (A) 08/15/2021   Wt Readings from Last 3 Encounters:  08/17/22 166 lb 8 oz (75.5 kg)  07/15/22 164 lb 6.4 oz (74.6 kg)  02/13/22 169 lb (76.7 kg)   Last seen for diabetes 6 months ago.  Management since then includes continue current medications. She reports excellent compliance with treatment. She is not having side effects. Symptoms: No fatigue No foot ulcerations  No appetite changes No nausea  No paresthesia of the feet  No polydipsia  No polyuria No visual disturbances   No vomiting     Home blood sugar records:  checked on occasion  Episodes of hypoglycemia? No    Current insulin regiment: none Most Recent Eye Exam: 07/07/22 Current exercise: none Current diet habits: well balanced  Pertinent Labs: Lab Results  Component Value Date   CHOL 115 02/13/2022   HDL 65 02/13/2022   LDLCALC 28 02/13/2022   TRIG 124 02/13/2022   CHOLHDL 1.8 02/13/2022   Lab Results  Component Value Date   NA 142 01/28/2021   K 4.2 01/28/2021   CREATININE 0.75 01/28/2021   EGFR 79 01/28/2021   LABMICR 10.9 02/13/2022     ---------------------------------------------------------------------------------------------------   Medications: Outpatient Medications Prior to Visit  Medication Sig   Blood Glucose Monitoring Suppl (FREESTYLE LITE) w/Device KIT USE AS DIRECTED    FREESTYLE TEST STRIPS test strip TEST TID UTD   Lancets (FREESTYLE) lancets USE TID   metFORMIN (GLUCOPHAGE-XR) 500 MG 24 hr tablet TAKE 1 TABLET BY MOUTH TWICE DAILY   metoprolol tartrate (LOPRESSOR) 25 MG tablet Take 25 mg by mouth 2 (two) times daily.   Multiple Vitamin (MULTIVITAMIN) tablet Take 1 tablet by mouth daily.   rosuvastatin (CRESTOR) 10 MG tablet Take 10 mg by mouth daily.   Semaglutide, 1 MG/DOSE, (OZEMPIC, 1 MG/DOSE,) 4 MG/3ML SOPN INJECT 1MG SUBCUTANEOUSLY  ONCE A WEEK   zolpidem (AMBIEN) 5 MG tablet Take 1 tablet (5 mg total) by mouth at bedtime as needed. Do not fill <30 days from last refill   omeprazole (PRILOSEC) 20 MG capsule Take 1 capsule (20 mg total) by mouth daily. (Patient not taking: Reported on 08/17/2022)   ondansetron (ZOFRAN-ODT) 4 MG disintegrating tablet Take 1 tablet (4 mg total) by mouth every 8 (eight) hours as needed for nausea or vomiting. (Patient not taking: Reported on 08/17/2022)   No facility-administered medications prior to visit.    Review of Systems     Objective    BP (!) 146/81 (BP Location: Right Arm, Patient Position: Sitting, Cuff Size: Normal)   Pulse 77   Ht _0  (1.702 m)   Wt 166 lb 8 oz (75.5 kg)   SpO2 100%   BMI 26.08 kg/m    Physical Exam  Gen: well appearing, in NAD Card: Reg rate Lungs: Comfortable WOB on RA Ext:  WWP   Results for orders placed or performed in visit on 08/17/22  POCT HgB A1C  Result Value Ref Range   Hemoglobin A1C 6.8 (A) 4.0 - 5.6 %   HbA1c POC (<> result, manual entry)     HbA1c, POC (prediabetic range)     HbA1c, POC (controlled diabetic range)      Assessment & Plan     Problem List Items Addressed This Visit       Endocrine   T2DM (type 2 diabetes mellitus) ([place]) - Primary    At goal for age, no changes. UTD with eye and foot exam and UACR. Obtaining BMP today. F/u 6 mths.      Relevant Orders   POCT HgB A1C (Completed)   Basic Metabolic Panel (BMET)     Other   Melanoma  ([place])    F/b Onc. On scalp. Recent shoulder biopsy with malignant path.      Nausea    Improved. Was likely 2/2 chemo given timing and now self-resolved without need for PPI, zofran. Continue to monitor.      Palpitations    On metoprolol prn, has only needed twice. Previously seen by Cardiology. Continue to monitor.        Return in about 6 months (around 02/16/2023) for dm.        Myles Gip, [place] [phone] (phone) [phone] (fax)  [place]

## 2022-08-14 NOTE — Telephone Encounter (Signed)
Refill for 30 day supply sent to patient's pharmacy    Eulis Foster, MD  [facility]

## 2022-08-17 ENCOUNTER — Ambulatory Visit: Payer: Medicare Other | Admitting: Family Medicine

## 2022-08-17 ENCOUNTER — Encounter: Payer: Self-pay | Admitting: Family Medicine

## 2022-08-17 ENCOUNTER — Ambulatory Visit (INDEPENDENT_AMBULATORY_CARE_PROVIDER_SITE_OTHER): Payer: Medicare Other | Admitting: Family Medicine

## 2022-08-17 VITALS — BP 146/81 | HR 77 | Ht 67.0 in | Wt 166.5 lb

## 2022-08-17 DIAGNOSIS — C434 Malignant melanoma of scalp and neck: Secondary | ICD-10-CM

## 2022-08-17 DIAGNOSIS — R110 Nausea: Secondary | ICD-10-CM | POA: Diagnosis not present

## 2022-08-17 DIAGNOSIS — R002 Palpitations: Secondary | ICD-10-CM

## 2022-08-17 DIAGNOSIS — R809 Proteinuria, unspecified: Secondary | ICD-10-CM | POA: Diagnosis not present

## 2022-08-17 DIAGNOSIS — E1129 Type 2 diabetes mellitus with other diabetic kidney complication: Secondary | ICD-10-CM | POA: Diagnosis not present

## 2022-08-17 LAB — POCT GLYCOSYLATED HEMOGLOBIN (HGB A1C): Hemoglobin A1C: 6.8 % — AB (ref 4.0–5.6)

## 2022-08-17 NOTE — Assessment & Plan Note (Addendum)
Improved. Was likely 2/2 chemo given timing and now self-resolved without need for PPI, zofran. Continue to monitor.

## 2022-08-17 NOTE — Assessment & Plan Note (Addendum)
F/b Onc. On scalp. Recent shoulder biopsy with malignant path.

## 2022-08-17 NOTE — Assessment & Plan Note (Signed)
At goal for age, no changes. UTD with eye and foot exam and UACR. Obtaining BMP today. F/u 6 mths.

## 2022-08-17 NOTE — Assessment & Plan Note (Signed)
On metoprolol prn, has only needed twice. Previously seen by Cardiology. Continue to monitor.

## 2022-08-18 ENCOUNTER — Ambulatory Visit: Payer: Medicare Other | Admitting: Family Medicine

## 2022-08-18 LAB — BASIC METABOLIC PANEL
BUN/Creatinine Ratio: 14 (ref 12–28)
BUN: 9 mg/dL (ref 8–27)
CO2: 22 mmol/L (ref 20–29)
Calcium: 9.6 mg/dL (ref 8.7–10.3)
Chloride: 102 mmol/L (ref 96–106)
Creatinine, Ser: 0.66 mg/dL (ref 0.57–1.00)
Glucose: 158 mg/dL — ABNORMAL HIGH (ref 70–99)
Potassium: 4.2 mmol/L (ref 3.5–5.2)
Sodium: 141 mmol/L (ref 134–144)
eGFR: 86 mL/min/{1.73_m2} (ref >59)

## 2022-08-31 ENCOUNTER — Ambulatory Visit (INDEPENDENT_AMBULATORY_CARE_PROVIDER_SITE_OTHER): Payer: Medicare Other

## 2022-08-31 VITALS — Ht 67.0 in | Wt 166.0 lb

## 2022-08-31 DIAGNOSIS — Z0000 Encounter for general adult medical examination without abnormal findings: Secondary | ICD-10-CM | POA: Diagnosis not present

## 2022-08-31 NOTE — Patient Instructions (Signed)
Raven Lewis , Thank you for taking time to come for your Medicare Wellness Visit. I appreciate your ongoing commitment to your health goals. Please review the following plan we discussed and let me know if I can assist you in the future.   Screening recommendations/referrals: Colonoscopy: aged out Mammogram: aged out Bone Density: aged out Recommended yearly ophthalmology/optometry visit for glaucoma screening and checkup Recommended yearly dental visit for hygiene and checkup  Vaccinations: Influenza vaccine: up to date per pt Pneumococcal vaccine: 08/31/13 Tdap vaccine: n/d Shingles vaccine: Zostavax 05/08/14 Shingrix 10/20/17, 12/18/17   Covid-19:09/25/19, 10/16/19, 06/27/20, 07/22/22  Advanced directives: no  Conditions/risks identified: none  Next appointment: Follow up in one year for your annual wellness visit 09/02/23 @ 9 am by phone   Preventive Care 65 Years and Older, Female Preventive care refers to lifestyle choices and visits with your health care provider that can promote health and wellness. What does preventive care include? A yearly physical exam. This is also called an annual well check. Dental exams once or twice a year. Routine eye exams. Ask your health care provider how often you should have your eyes checked. Personal lifestyle choices, including: Daily care of your teeth and gums. Regular physical activity. Eating a healthy diet. Avoiding tobacco and drug use. Limiting alcohol use. Practicing safe sex. Taking low-dose aspirin every day. Taking vitamin and mineral supplements as recommended by your health care provider. What happens during an annual well check? The services and screenings done by your health care provider during your annual well check will depend on your age, overall health, lifestyle risk factors, and family history of disease. Counseling  Your health care provider may ask you questions about your: Alcohol use. Tobacco use. Drug  use. Emotional well-being. Home and relationship well-being. Sexual activity. Eating habits. History of falls. Memory and ability to understand (cognition). Work and work Statistician. Reproductive health. Screening  You may have the following tests or measurements: Height, weight, and BMI. Blood pressure. Lipid and cholesterol levels. These may be checked every 5 years, or more frequently if you are over 26 years old. Skin check. Lung cancer screening. You may have this screening every year starting at age 86 if you have a 30-pack-year history of smoking and currently smoke or have quit within the past 15 years. Fecal occult blood test (FOBT) of the stool. You may have this test every year starting at age 76. Flexible sigmoidoscopy or colonoscopy. You may have a sigmoidoscopy every 5 years or a colonoscopy every 10 years starting at age 21. Hepatitis C blood test. Hepatitis B blood test. Sexually transmitted disease (STD) testing. Diabetes screening. This is done by checking your blood sugar (glucose) after you have not eaten for a while (fasting). You may have this done every 1-3 years. Bone density scan. This is done to screen for osteoporosis. You may have this done starting at age 33. Mammogram. This may be done every 1-2 years. Talk to your health care provider about how often you should have regular mammograms. Talk with your health care provider about your test results, treatment options, and if necessary, the need for more tests. Vaccines  Your health care provider may recommend certain vaccines, such as: Influenza vaccine. This is recommended every year. Tetanus, diphtheria, and acellular pertussis (Tdap, Td) vaccine. You may need a Td booster every 10 years. Zoster vaccine. You may need this after age 70. Pneumococcal 13-valent conjugate (PCV13) vaccine. One dose is recommended after age 51. Pneumococcal polysaccharide (PPSV23) vaccine.  One dose is recommended after age  9. Talk to your health care provider about which screenings and vaccines you need and how often you need them. This information is not intended to replace advice given to you by your health care provider. Make sure you discuss any questions you have with your health care provider. Document Released: 09/27/2015 Document Revised: 05/20/2016 Document Reviewed: 07/02/2015 Elsevier Interactive Patient Education  2017 [place] Prevention in the Home Falls can cause injuries. They can happen to people of all ages. There are many things you can do to make your home safe and to help prevent falls. What can I do on the outside of my home? Regularly fix the edges of walkways and driveways and fix any cracks. Remove anything that might make you trip as you walk through a door, such as a raised step or threshold. Trim any bushes or trees on the path to your home. Use bright outdoor lighting. Clear any walking paths of anything that might make someone trip, such as rocks or tools. Regularly check to see if handrails are loose or broken. Make sure that both sides of any steps have handrails. Any raised decks and porches should have guardrails on the edges. Have any leaves, snow, or ice cleared regularly. Use sand or salt on walking paths during winter. Clean up any spills in your garage right away. This includes oil or grease spills. What can I do in the bathroom? Use night lights. Install grab bars by the toilet and in the tub and shower. Do not use towel bars as grab bars. Use non-skid mats or decals in the tub or shower. If you need to sit down in the shower, use a plastic, non-slip stool. Keep the floor dry. Clean up any water that spills on the floor as soon as it happens. Remove soap buildup in the tub or shower regularly. Attach bath mats securely with double-sided non-slip rug tape. Do not have throw rugs and other things on the floor that can make you trip. What can I do in the  bedroom? Use night lights. Make sure that you have a light by your bed that is easy to reach. Do not use any sheets or blankets that are too big for your bed. They should not hang down onto the floor. Have a firm chair that has side arms. You can use this for support while you get dressed. Do not have throw rugs and other things on the floor that can make you trip. What can I do in the kitchen? Clean up any spills right away. Avoid walking on wet floors. Keep items that you use a lot in easy-to-reach places. If you need to reach something above you, use a strong step stool that has a grab bar. Keep electrical cords out of the way. Do not use floor polish or wax that makes floors slippery. If you must use wax, use non-skid floor wax. Do not have throw rugs and other things on the floor that can make you trip. What can I do with my stairs? Do not leave any items on the stairs. Make sure that there are handrails on both sides of the stairs and use them. Fix handrails that are broken or loose. Make sure that handrails are as long as the stairways. Check any carpeting to make sure that it is firmly attached to the stairs. Fix any carpet that is loose or worn. Avoid having throw rugs at the top or bottom of  the stairs. If you do have throw rugs, attach them to the floor with carpet tape. Make sure that you have a light switch at the top of the stairs and the bottom of the stairs. If you do not have them, ask someone to add them for you. What else can I do to help prevent falls? Wear shoes that: Do not have high heels. Have rubber bottoms. Are comfortable and fit you well. Are closed at the toe. Do not wear sandals. If you use a stepladder: Make sure that it is fully opened. Do not climb a closed stepladder. Make sure that both sides of the stepladder are locked into place. Ask someone to hold it for you, if possible. Clearly mark and make sure that you can see: Any grab bars or  handrails. First and last steps. Where the edge of each step is. Use tools that help you move around (mobility aids) if they are needed. These include: Canes. Walkers. Scooters. Crutches. Turn on the lights when you go into a dark area. Replace any light bulbs as soon as they burn out. Set up your furniture so you have a clear path. Avoid moving your furniture around. If any of your floors are uneven, fix them. If there are any pets around you, be aware of where they are. Review your medicines with your doctor. Some medicines can make you feel dizzy. This can increase your chance of falling. Ask your doctor what other things that you can do to help prevent falls. This information is not intended to replace advice given to you by your health care provider. Make sure you discuss any questions you have with your health care provider. Document Released: 06/27/2009 Document Revised: 02/06/2016 Document Reviewed: 10/05/2014 Elsevier Interactive Patient Education  2017 [employer].

## 2022-08-31 NOTE — Progress Notes (Signed)
Virtual Visit via Telephone Note  I connected with  Raven Lewis on 08/31/22 at 11:30 AM EST by telephone and verified that I am speaking with the correct person using two identifiers.  Location: Patient: home Provider: [facility] Persons participating in the virtual visit: [place]   I discussed the limitations, risks, security and privacy concerns of performing an evaluation and management service by telephone and the availability of in person appointments. The patient expressed understanding and agreed to proceed.  Interactive audio and video telecommunications were attempted between this nurse and patient, however failed, due to patient having technical difficulties OR patient did not have access to video capability.  We continued and completed visit with audio only.  Some vital signs may be absent or patient reported.   Dionisio David, LPN  Subjective:   Raven Lewis is a 87 y.o. female who presents for Medicare Annual (Subsequent) preventive examination.  Review of Systems     Cardiac Risk Factors include: advanced age (>69mn, >>57women);diabetes mellitus     Objective:    There were no vitals filed for this visit. There is no height or weight on file to calculate BMI.     08/31/2022   11:36 AM 08/28/2021    1:48 PM 02/20/2019    1:44 PM  Advanced Directives  Does Patient Have a Medical Advance Directive? No No Yes  Type of Advance Directive   Living will;Healthcare Power of Attorney  Does patient want to make changes to medical advance directive?   No - Patient declined  Copy of [place] Chart?   No - copy requested  Would patient like information on creating a medical advance directive? No - Patient declined No - Patient declined     Current Medications (verified) Outpatient Encounter Medications as of 08/31/2022  Medication Sig   Blood Glucose Monitoring Suppl (FREESTYLE LITE) w/Device KIT USE AS DIRECTED   FREESTYLE  TEST STRIPS test strip TEST TID UTD   Lancets (FREESTYLE) lancets USE TID   metFORMIN (GLUCOPHAGE-XR) 500 MG 24 hr tablet TAKE 1 TABLET BY MOUTH TWICE DAILY   metoprolol tartrate (LOPRESSOR) 25 MG tablet Take 25 mg by mouth 2 (two) times daily.   Multiple Vitamin (MULTIVITAMIN) tablet Take 1 tablet by mouth daily.   ondansetron (ZOFRAN-ODT) 4 MG disintegrating tablet Take 1 tablet (4 mg total) by mouth every 8 (eight) hours as needed for nausea or vomiting.   rosuvastatin (CRESTOR) 10 MG tablet Take 10 mg by mouth daily.   Semaglutide, 1 MG/DOSE, (OZEMPIC, 1 MG/DOSE,) 4 MG/3ML SOPN INJECT 1MG SUBCUTANEOUSLY  ONCE A WEEK   zolpidem (AMBIEN) 5 MG tablet Take 1 tablet (5 mg total) by mouth at bedtime as needed. Do not fill <30 days from last refill   omeprazole (PRILOSEC) 20 MG capsule Take 1 capsule (20 mg total) by mouth daily. (Patient not taking: Reported on 08/17/2022)   No facility-administered encounter medications on file as of 08/31/2022.    Allergies (verified) Patient has no known allergies.   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) 12/20/2018   Past Surgical History:  Procedure Laterality Date   CATARACT EXTRACTION Left    ELBOW SURGERY     MRSA in right elbow   TUBAL LIGATION     WRIST SURGERY     left wrist   Family History  Problem Relation Age of Onset   Breast cancer Mother 15   Heart disease Sister        heart failure   Lung cancer Brother    Heart disease Sister    Colon cancer Neg Hx    Social History   Socioeconomic History    Marital status: Widowed    Spouse name: Not on file   Number of children: 2   Years of education: Not on file   Highest education level: Not on file  Occupational History   Occupation: retired  Tobacco Use   Smoking status: Former    Packs/day: 1.50    Years: 40.00    Total pack years: 60.00    Types: Cigarettes    Quit date: 11/29/2000    Years since quitting: 21.7   Smokeless tobacco: Never  Vaping Use   Vaping Use: Never used  Substance and Sexual Activity   Alcohol use: Yes    Alcohol/week: 1.0 standard drink of alcohol    Types: 1 Glasses of wine per week    Comment: occasionally, less than weekly   Drug use: Never   Sexual activity: Not Currently  Other Topics Concern   Not on file  Social History Narrative   Not on file   Social Determinants of Health   Financial Resource Strain: Low Risk  (08/31/2022)   Overall Financial Resource Strain (CARDIA)    Difficulty of Paying Living Expenses: Not hard at all  Food Insecurity: No Food Insecurity (08/31/2022)   Hunger Vital Sign    Worried About Running Out of Food in the Last Year: Never true    Ran Out of Food in the Last Year: Never true  Transportation Needs: No Transportation Needs (08/31/2022)   PRAPARE - Hydrologist (Medical): No    Lack of Transportation (Non-Medical): No  Physical Activity: Insufficiently Active (08/31/2022)   Exercise Vital Sign    Days of Exercise per Week: 3 days    Minutes of Exercise per Session: 30 min  Stress: No Stress Concern Present (08/31/2022)   [place]    Feeling of Stress : Not at all  Social Connections: Moderately Isolated (08/31/2022)   Social Connection and Isolation Panel [NHANES]    Frequency of Communication with Friends and Family: More than three times a week    Frequency of Social Gatherings with Friends and Family: Once a week    Attends Religious Services: Never     Marine scientist or Organizations: Yes    Attends Music therapist: More than 4 times per year    Marital Status: Widowed    Tobacco Counseling Counseling given: Not Answered   Clinical Intake:  Pre-visit preparation completed: Yes  Pain : No/denies pain     Nutritional Risks: None Diabetes: Yes CBG done?: No Did pt. bring in CBG monitor from home?: No  How often do you need to have someone help you when you read instructions, pamphlets, or other written materials from your doctor or pharmacy?: 1 - Never  Diabetic?yes Nutrition Risk Assessment:  Has the patient had any N/V/D within the last  2 months?  No  Does the patient have any non-healing wounds?  No  Has the patient had any unintentional weight loss or weight gain?  No   Diabetes:  Is the patient diabetic?  Yes  If diabetic, was a CBG obtained today?  No  Did the patient bring in their glucometer from home?  No  How often do you monitor your CBG's? Every day.   Financial Strains and Diabetes Management:  Are you having any financial strains with the device, your supplies or your medication? No .  Does the patient want to be seen by Chronic Care Management for management of their diabetes?  No  Would the patient like to be referred to a Nutritionist or for Diabetic Management?  No   Diabetic Exams:  Diabetic Eye Exam: Completed 07/07/22. Pt has been advised about the importance in completing this exam.  Diabetic Foot Exam: Completed 02/13/22. Pt has been advised about the importance in completing this exam.   Interpreter Needed?: No  Information entered by :: Kirke Shaggy, LPN   Activities of Daily Living    08/31/2022   11:38 AM 07/15/2022   11:36 AM  In your present state of health, do you have any difficulty performing the following activities:  Hearing? 1 1  Vision? 0 0  Difficulty concentrating or making decisions? 0 0  Walking or climbing stairs? 0 0  Dressing or bathing? 0 0   Doing errands, shopping? 0 0  Preparing Food and eating ? N   Using the Toilet? N   In the past six months, have you accidently leaked urine? N   Do you have problems with loss of bowel control? N   Managing your Medications? N   Managing your Finances? N   Housekeeping or managing your Housekeeping? N     Patient Care Team: Virginia Crews, MD as PCP - General (Family Medicine)  Indicate any recent Medical Services you may have received from other than [facility] providers in the past year (date may be approximate).     Assessment:   This is a routine wellness examination for Raven Lewis.  Hearing/Vision screen Hearing Screening - Comments:: No aids Vision Screening - Comments:: Readers- Dr.Jackson  Dietary issues and exercise activities discussed: Current Exercise Habits: Home exercise routine, Type of exercise: walking, Time (Minutes): 30, Frequency (Times/Week): 3, Weekly Exercise (Minutes/Week): 90, Intensity: Mild   Goals Addressed             This Visit's Progress    DIET - EAT MORE FRUITS AND VEGETABLES         Depression Screen    08/31/2022   11:33 AM 07/15/2022   11:35 AM 02/13/2022   11:03 AM 08/28/2021    1:50 PM 08/28/2021    1:45 PM 10/29/2020    9:12 AM 08/20/2020    1:40 PM  PHQ 2/9 Scores  PHQ - 2 Score 0 0 0 0 0 0 0  PHQ- 9 Score 0 0 1   0 7    Fall Risk    08/31/2022   11:38 AM 07/15/2022   11:35 AM 08/28/2021    1:50 PM 10/29/2020    9:12 AM 07/16/2020    9:54 AM  Fall Risk   Falls in the past year? 0 0 0 0 0  Number falls in past yr: 0 0 0 0 0  Injury with Fall? 0 0 0 0 0  Risk for fall due to : No Fall Risks No Fall  Risks No Fall Risks  No Fall Risks  Follow up Falls prevention discussed;Falls evaluation completed Falls evaluation completed Falls prevention discussed  Falls evaluation completed    FALL RISK PREVENTION PERTAINING TO THE HOME:  Any stairs in or around the home? Yes  If so, are there any without handrails? No  Home free of  loose throw rugs in walkways, pet beds, electrical cords, etc? Yes  Adequate lighting in your home to reduce risk of falls? Yes   ASSISTIVE DEVICES UTILIZED TO PREVENT FALLS:  Life alert? No  Use of a cane, walker or w/c? No  Grab bars in the bathroom? No  Shower chair or bench in shower? Yes  Elevated toilet seat or a handicapped toilet? Yes    Cognitive Function:        08/31/2022   11:41 AM  6CIT Screen  What Year? 0 points  What month? 0 points  What time? 0 points  Count back from 20 0 points  Months in reverse 0 points  Repeat phrase 0 points  Total Score 0 points    Immunizations Immunization History  Administered Date(s) Administered   Fluad Quad(high Dose 65+) 07/15/2021   Influenza Inj Mdck Quad Pf 06/20/2018, 06/26/2019   Influenza,inj,Quad PF,6+ Mos 06/26/2019, 06/12/2020   Influenza-Unspecified 08/03/2022   PFIZER Comirnaty(Gray Top)Covid-19 Tri-Sucrose Vaccine 09/25/2019, 10/16/2019, 06/27/2020   PFIZER(Purple Top)SARS-COV-2 Vaccination 09/25/2019, 10/16/2019, 06/27/2020   Pneumococcal Conjugate-13 08/31/2013   Pneumococcal Polysaccharide-23 10/16/2003   Td (Adult),unspecified 06/09/2021   Unspecified SARS-COV-2 Vaccination 07/22/2022   Zoster Recombinat (Shingrix) 10/20/2017, 12/18/2017   Zoster, Live 05/08/2014    TDAP status: Due, Education has been provided regarding the importance of this vaccine. Advised may receive this vaccine at local pharmacy or Health Dept. Aware to provide a copy of the vaccination record if obtained from local pharmacy or Health Dept. Verbalized acceptance and understanding.  Flu Vaccine status: Up to date  Pneumococcal vaccine status: Up to date  Covid-19 vaccine status: Completed vaccines  Qualifies for Shingles Vaccine? Yes   Zostavax completed Yes   Shingrix Completed?: Yes  Screening Tests Health Maintenance  Topic Date Due   DTaP/Tdap/Td (1 - Tdap) 06/10/2021   DEXA SCAN  02/13/2023 (Originally 03/27/2002)    COVID-19 Vaccine (8 - 2023-24 season) 09/16/2022   Diabetic kidney evaluation - Urine ACR  02/14/2023   FOOT EXAM  02/14/2023   HEMOGLOBIN A1C  02/16/2023   OPHTHALMOLOGY EXAM  07/08/2023   Diabetic kidney evaluation - eGFR measurement  08/18/2023   Medicare Annual Wellness (AWV)  09/01/2023   Pneumonia Vaccine 69+ Years old  Completed   INFLUENZA VACCINE  Completed   Zoster Vaccines- Shingrix  Completed   HPV VACCINES  Aged Out    Health Maintenance  Health Maintenance Due  Topic Date Due   DTaP/Tdap/Td (1 - Tdap) 06/10/2021    Colorectal cancer screening: No longer required.   Mammogram status: No longer required due to age.  Lung Cancer Screening: (Low Dose CT Chest recommended if Age 10-80 years, 30 pack-year currently smoking OR have quit w/in 15years.) does not qualify.    Additional Screening:  Hepatitis C Screening: does not qualify; Completed no  Vision Screening: Recommended annual ophthalmology exams for early detection of glaucoma and other disorders of the eye. Is the patient up to date with their annual eye exam?  Yes  Who is the provider or what is the name of the office in which the patient attends annual eye exams? [facility] If  pt is not established with a provider, would they like to be referred to a provider to establish care? No .   Dental Screening: Recommended annual dental exams for proper oral hygiene  Community Resource Referral / Chronic Care Management: CRR required this visit?  No   CCM required this visit?  No      Plan:     I have personally reviewed and noted the following in the patient's chart:   Medical and social history Use of alcohol, tobacco or illicit drugs  Current medications and supplements including opioid prescriptions. Patient is not currently taking opioid prescriptions. Functional ability and status Nutritional status Physical activity Advanced directives List of other physicians Hospitalizations,  surgeries, and ER visits in previous 12 months Vitals Screenings to include cognitive, depression, and falls Referrals and appointments  In addition, I have reviewed and discussed with patient certain preventive protocols, quality metrics, and best practice recommendations. A written personalized care plan for preventive services as well as general preventive health recommendations were provided to patient.     Dionisio David, LPN   [id number]   Nurse Notes: none

## 2022-09-05 ENCOUNTER — Other Ambulatory Visit: Payer: Self-pay | Admitting: Family Medicine

## 2022-09-13 ENCOUNTER — Other Ambulatory Visit: Payer: Self-pay | Admitting: Family Medicine

## 2022-09-15 ENCOUNTER — Other Ambulatory Visit: Payer: Self-pay | Admitting: Family Medicine

## 2022-09-15 NOTE — Telephone Encounter (Signed)
Requested medication (s) are due for refill today: yes  Requested medication (s) are on the active medication list: yes  Last refill:  03/18/22 #180 1 RF  Future visit scheduled: yes  Notes to clinic:  overdue lab work   Requested Prescriptions  Pending Prescriptions Disp Refills   metFORMIN (GLUCOPHAGE-XR) 500 MG 24 hr tablet [Pharmacy Med Name: METFORMIN ER 500MG 24HR TABS] 180 tablet 1    Sig: TAKE 1 TABLET BY MOUTH TWICE DAILY     Endocrinology:  Diabetes - Biguanides Failed - 09/13/2022  6:40 AM      Failed - B12 Level in normal range and within 720 days    No results found for: "VITAMINB12"       Failed - CBC within normal limits and completed in the last 12 months    WBC  Date Value Ref Range Status  07/16/2020 6.6 3.4 - 10.8 x10E3/uL Final   RBC  Date Value Ref Range Status  07/16/2020 4.09 3.77 - 5.28 x10E6/uL Final   Hemoglobin  Date Value Ref Range Status  07/16/2020 13.7 11.1 - 15.9 g/dL Final   Hematocrit  Date Value Ref Range Status  07/16/2020 40.6 34.0 - 46.6 % Final   MCHC  Date Value Ref Range Status  07/16/2020 33.7 31.5 - 35.7 g/dL Final   [facility]  Date Value Ref Range Status  07/16/2020 33.5 (H) 26.6 - 33.0 pg Final   MCV  Date Value Ref Range Status  07/16/2020 99 (H) 79 - 97 fL Final   No results found for: "PLTCOUNTKUC", "LABPLAT", "POCPLA" RDW  Date Value Ref Range Status  07/16/2020 12.8 11.7 - 15.4 % Final         Passed - Cr in normal range and within 360 days    Creatinine, Ser  Date Value Ref Range Status  08/17/2022 0.66 0.57 - 1.00 mg/dL Final         Passed - HBA1C is between 0 and 7.9 and within 180 days    Hemoglobin A1C  Date Value Ref Range Status  08/17/2022 6.8 (A) 4.0 - 5.6 % Final  02/06/2020 6.8  Final   Hgb A1c MFr Bld  Date Value Ref Range Status  01/28/2021 7.4 (H) 4.8 - 5.6 % Final    Comment:             Prediabetes: 5.7 - 6.4          Diabetes: >6.4          Glycemic control for adults with diabetes:  <7.0          Passed - eGFR in normal range and within 360 days    GFR calc Af Amer  Date Value Ref Range Status  07/16/2020 91 >59 mL/min/1.73 Final    Comment:    **In accordance with recommendations from the NKF-ASN Task force,**   [facility] is in the process of updating its eGFR calculation to the   2021 CKD-EPI creatinine equation that estimates kidney function   without a race variable.    GFR calc non Af Amer  Date Value Ref Range Status  07/16/2020 79 >59 mL/min/1.73 Final   eGFR  Date Value Ref Range Status  08/17/2022 86 >59 mL/min/1.73 Final         Passed - Valid encounter within last 6 months    Recent Outpatient Visits           4 weeks ago Type 2 diabetes mellitus with microalbuminuria, without long-term current use  of insulin [facility])   [place], [place], DO   2 months ago Nausea   [place], DO   7 months ago Type 2 diabetes mellitus with microalbuminuria, without long-term current use of insulin [facility])   [facility] [place], Dionne Bucy, MD   1 year ago Type 2 diabetes mellitus with microalbuminuria, without long-term current use of insulin [facility])   [facility], Dionne Bucy, MD   1 year ago Type 2 diabetes mellitus with microalbuminuria, without long-term current use of insulin [facility])   [facility], Dionne Bucy, MD       Future Appointments             In 5 months Bacigalupo, Dionne Bucy, MD [facility]

## 2022-10-05 ENCOUNTER — Other Ambulatory Visit: Payer: Self-pay

## 2022-10-05 DIAGNOSIS — E1129 Type 2 diabetes mellitus with other diabetic kidney complication: Secondary | ICD-10-CM

## 2022-10-11 ENCOUNTER — Other Ambulatory Visit: Payer: Self-pay | Admitting: Family Medicine

## 2022-10-19 ENCOUNTER — Other Ambulatory Visit: Payer: Self-pay | Admitting: Family Medicine

## 2022-10-20 NOTE — Telephone Encounter (Signed)
Requested Prescriptions  Pending Prescriptions Disp Refills   omeprazole (PRILOSEC) 20 MG capsule [Pharmacy Med Name: OMEPRAZOLE '20MG'$  CAPSULES] 90 capsule 0    Sig: TAKE 1 CAPSULE(20 MG) BY MOUTH DAILY     Gastroenterology: Proton Pump Inhibitors Passed - 10/19/2022 12:42 PM      Passed - Valid encounter within last 12 months    Recent Outpatient Visits           2 months ago Type 2 diabetes mellitus with microalbuminuria, without long-term current use of insulin [facility])   [place], DO   3 months ago Nausea   [place] Rory Percy M, DO   8 months ago Type 2 diabetes mellitus with microalbuminuria, without long-term current use of insulin [facility])   [place], Dionne Bucy, MD   1 year ago Type 2 diabetes mellitus with microalbuminuria, without long-term current use of insulin [facility])   [place], Dionne Bucy, MD   1 year ago Type 2 diabetes mellitus with microalbuminuria, without long-term current use of insulin [facility])   [place], Dionne Bucy, MD       Future Appointments             In 3 months Bacigalupo, Dionne Bucy, MD [facility]

## 2022-11-02 ENCOUNTER — Other Ambulatory Visit: Payer: Self-pay | Admitting: Family Medicine

## 2022-11-02 DIAGNOSIS — E1129 Type 2 diabetes mellitus with other diabetic kidney complication: Secondary | ICD-10-CM

## 2022-11-02 NOTE — Telephone Encounter (Signed)
Copied from [place] [id number]. Topic: General - Other >> Nov 02, 2022 10:35 AM Everette C wrote: Reason for CRM: Medication Refill - Medication: Freestyle lancets  Freestyle test strips   The patient is requesting a three month supply with instructions to test three times daily   The patient has no supplies remaining   Has the patient contacted their pharmacy? Yes.   (Agent: If no, request that the patient contact the pharmacy for the refill. If patient does not wish to contact the pharmacy document the reason why and proceed with request.) (Agent: If yes, when and what did the pharmacy advise?)  Preferred Pharmacy (with phone number or street name): [id number] - [place], [place] - [address] [place] [address] Phone: [phone] Fax: [phone] Hours: Not open 24 hours   Has the patient been seen for an appointment in the last year OR does the patient have an upcoming appointment? Yes.    Agent: Please be advised that RX refills may take up to 3 business days. We ask that you follow-up with your pharmacy.

## 2022-11-03 MED ORDER — FREESTYLE TEST VI STRP: ORAL_STRIP | 0 refills | Status: DC

## 2022-11-03 MED ORDER — FREESTYLE LANCETS MISC: 0 refills | Status: DC

## 2022-11-03 NOTE — Telephone Encounter (Signed)
Requested Prescriptions  Pending Prescriptions Disp Refills   FREESTYLE TEST STRIPS test strip 100 each 0    Sig: TEST TID UTD     Endocrinology: Diabetes - Testing Supplies Passed - 11/02/2022 11:52 AM      Passed - Valid encounter within last 12 months    Recent Outpatient Visits           2 months ago Type 2 diabetes mellitus with microalbuminuria, without long-term current use of insulin [facility])   [place] Rory Percy M, DO   3 months ago Nausea   [place] Rory Percy M, DO   8 months ago Type 2 diabetes mellitus with microalbuminuria, without long-term current use of insulin [facility])   [place], Dionne Bucy, MD   1 year ago Type 2 diabetes mellitus with microalbuminuria, without long-term current use of insulin [facility])   [place], Dionne Bucy, MD   1 year ago Type 2 diabetes mellitus with microalbuminuria, without long-term current use of insulin [facility])   [place], Dionne Bucy, MD       Future Appointments             In 3 months Bacigalupo, Dionne Bucy, MD [facility]             Lancets (FREESTYLE) lancets 100 each 0    Sig: USE TID     Endocrinology: Diabetes - Testing Supplies Passed - 11/02/2022 11:52 AM      Passed - Valid encounter within last 12 months    Recent Outpatient Visits           2 months ago Type 2 diabetes mellitus with microalbuminuria, without long-term current use of insulin [facility])   [place], DO   3 months ago Nausea   [place] Rory Percy M, DO   8 months ago Type 2 diabetes mellitus with microalbuminuria, without long-term current use of insulin [facility])   [place], Dionne Bucy, MD   1 year ago Type 2 diabetes mellitus with  microalbuminuria, without long-term current use of insulin [facility])   [place], Dionne Bucy, MD   1 year ago Type 2 diabetes mellitus with microalbuminuria, without long-term current use of insulin [facility])   [place], Dionne Bucy, MD       Future Appointments             In 3 months Bacigalupo, Dionne Bucy, MD [facility]

## 2022-11-03 NOTE — Telephone Encounter (Signed)
Requested medication (s) are due for refill today: expired medications  Requested medication (s) are on the active medication list: yes  Last refill:  freestyle lancets and test strips- 09/16/21 #100 2 refills  Future visit scheduled: yes in 3 months  Notes to clinic:  expired medications. Out of supplies. Patient requesting 3 month supply and order to test 3 times daily. Do you want to renew Rx and order to check glucose 3 times daily?     Requested Prescriptions  Pending Prescriptions Disp Refills   FREESTYLE TEST STRIPS test strip 100 each 2    Sig: TEST TID UTD     Endocrinology: Diabetes - Testing Supplies Passed - 11/02/2022 11:52 AM      Passed - Valid encounter within last 12 months    Recent Outpatient Visits           2 months ago Type 2 diabetes mellitus with microalbuminuria, without long-term current use of insulin [facility])   [place] Rory Percy M, DO   3 months ago Nausea   [place] Rory Percy M, DO   8 months ago Type 2 diabetes mellitus with microalbuminuria, without long-term current use of insulin [facility])   [place], Dionne Bucy, MD   1 year ago Type 2 diabetes mellitus with microalbuminuria, without long-term current use of insulin [facility])   [place], Dionne Bucy, MD   1 year ago Type 2 diabetes mellitus with microalbuminuria, without long-term current use of insulin [facility])   [place], Dionne Bucy, MD       Future Appointments             In 3 months Bacigalupo, Dionne Bucy, MD [facility]             Lancets (FREESTYLE) lancets 100 each 2    Sig: USE TID     Endocrinology: Diabetes - Testing Supplies Passed - 11/02/2022 11:52 AM      Passed - Valid encounter within last 12 months    Recent Outpatient Visits           2 months ago Type 2 diabetes  mellitus with microalbuminuria, without long-term current use of insulin [facility])   [place], DO   3 months ago Nausea   [place] Rory Percy M, DO   8 months ago Type 2 diabetes mellitus with microalbuminuria, without long-term current use of insulin [facility])   [place], Dionne Bucy, MD   1 year ago Type 2 diabetes mellitus with microalbuminuria, without long-term current use of insulin [facility])   [place], Dionne Bucy, MD   1 year ago Type 2 diabetes mellitus with microalbuminuria, without long-term current use of insulin [facility])   [place], Dionne Bucy, MD       Future Appointments             In 3 months Bacigalupo, Dionne Bucy, MD [facility], [facility]

## 2023-01-04 ENCOUNTER — Encounter: Payer: Self-pay | Admitting: Physician Assistant

## 2023-01-04 ENCOUNTER — Ambulatory Visit (INDEPENDENT_AMBULATORY_CARE_PROVIDER_SITE_OTHER): Payer: Medicare Other | Admitting: Physician Assistant

## 2023-01-04 VITALS — BP 163/88 | HR 77 | Temp 97.7°F | Wt 167.0 lb

## 2023-01-04 DIAGNOSIS — G4700 Insomnia, unspecified: Secondary | ICD-10-CM | POA: Diagnosis not present

## 2023-01-04 DIAGNOSIS — R809 Proteinuria, unspecified: Secondary | ICD-10-CM

## 2023-01-04 DIAGNOSIS — R030 Elevated blood-pressure reading, without diagnosis of hypertension: Secondary | ICD-10-CM

## 2023-01-04 DIAGNOSIS — E1129 Type 2 diabetes mellitus with other diabetic kidney complication: Secondary | ICD-10-CM

## 2023-01-04 LAB — POCT GLYCOSYLATED HEMOGLOBIN (HGB A1C): Hemoglobin A1C: 7.2 % — AB (ref 4.0–5.6)

## 2023-01-04 MED ORDER — ZOLPIDEM TARTRATE 5 MG PO TABS: 5.0000 mg | ORAL_TABLET | Freq: Every evening | ORAL | 0 refills | Status: DC | PRN

## 2023-01-04 NOTE — Progress Notes (Signed)
Established patient visit   Patient: Raven Lewis   DOB: 08-May-1935   88 y.o. Female  MRN: [id number] Visit Date: 01/04/2023  Today's healthcare provider: Debera Lat, PA-C   CC: DMII and insomnia FU  Subjective    HPI  Diabetes Mellitus Type II, Follow-up  Lab Results  Component Value Date   HGBA1C 7.2 (A) 01/04/2023   HGBA1C 6.8 (A) 08/17/2022   HGBA1C 6.8 (A) 02/13/2022   Wt Readings from Last 3 Encounters:  01/04/23 167 lb (75.8 kg)  08/31/22 166 lb (75.3 kg)  08/17/22 166 lb 8 oz (75.5 kg)   Last seen for diabetes more than 3 months ago.  Management since then includes Metformin 500mg  BID. She reports fair compliance with treatment. She is not having side effects.  Symptoms: No fatigue No foot ulcerations  No appetite changes No nausea  No paresthesia of the feet  No polydipsia  No polyuria No visual disturbances   No vomiting     Home blood sugar records: checked on occasion  Episodes of hypoglycemia? No    Current insulin regiment: none Most Recent Eye Exam: 07/07/22 Current exercise: none for now , planning to restart walking Current diet habits: well balanced but cannot stay away from ice cream  Pertinent Labs: Lab Results  Component Value Date   CHOL 115 02/13/2022   HDL 65 02/13/2022   LDLCALC 28 02/13/2022   TRIG 124 02/13/2022   CHOLHDL 1.8 02/13/2022   Lab Results  Component Value Date   NA 141 08/17/2022   K 4.2 08/17/2022   CREATININE 0.66 08/17/2022   EGFR 86 08/17/2022   LABMICR 10.9 02/13/2022   MICRALBCREAT 16 02/13/2022     ---------------------------------------------------------------------------------------------------   Lab Results  Component Value Date   HGBA1C 6.8 (A) 08/17/2022   HGBA1C 6.8 (A) 02/13/2022   HGBA1C 6.8 (A) 08/15/2021   GLUCOSE 158 (H) 08/17/2022   GLUCOSE 160 (H) 01/28/2021   GLUCOSE 135 (H) 07/16/2020     Pertinent Labs:    Component Value Date/Time   CHOL 115 02/13/2022 1302    TRIG 124 02/13/2022 1302   CHOLHDL 1.8 02/13/2022 1302   CREATININE 0.66 08/17/2022 1031    Wt Readings from Last 3 Encounters:  01/04/23 167 lb (75.8 kg)  08/31/22 166 lb (75.3 kg)  08/17/22 166 lb 8 oz (75.5 kg)   Insomnia, cannot sleep without taking zolpidem Per chart review, started taking zolpidem in 2021 after being diagnosed with melanoma. Pt competed a course of nivolumab and TVEC at [facility] on 06/23/22 -----------------------------------------------------------------------------------------   Medications: Outpatient Medications Prior to Visit  Medication Sig   Blood Glucose Monitoring Suppl (FREESTYLE LITE) w/Device KIT USE AS DIRECTED   FREESTYLE TEST STRIPS test strip TEST TID UTD   Lancets (FREESTYLE) lancets USE TID   metFORMIN (GLUCOPHAGE-XR) 500 MG 24 hr tablet TAKE 1 TABLET BY MOUTH TWICE DAILY   Multiple Vitamin (MULTIVITAMIN) tablet Take 1 tablet by mouth daily.   OZEMPIC, 1 MG/DOSE, 4 MG/3ML SOPN INJECT 1MG  SUBCUTANEOUSLY  ONCE A WEEK   rosuvastatin (CRESTOR) 10 MG tablet Take 10 mg by mouth daily.   zolpidem (AMBIEN) 5 MG tablet Take 1 tablet (5 mg total) by mouth at bedtime as needed for sleep.   metoprolol tartrate (LOPRESSOR) 25 MG tablet Take 25 mg by mouth 2 (two) times daily. (Patient not taking: Reported on 01/04/2023)   omeprazole (PRILOSEC) 20 MG capsule TAKE 1 CAPSULE(20 MG) BY MOUTH DAILY (Patient not taking: Reported  on 01/04/2023)   ondansetron (ZOFRAN-ODT) 4 MG disintegrating tablet Take 1 tablet (4 mg total) by mouth every 8 (eight) hours as needed for nausea or vomiting. (Patient not taking: Reported on 01/04/2023)   No facility-administered medications prior to visit.    Review of Systems  Constitutional:  Negative for appetite change, fatigue and fever.  Respiratory:  Negative for shortness of breath.   Cardiovascular:  Negative for chest pain.  Endocrine: Negative for polydipsia and polyuria.  Neurological:  Negative for dizziness,  numbness and headaches.  Psychiatric/Behavioral:  Positive for sleep disturbance.        Objective    BP (!) 163/88 (BP Location: Left Arm, Patient Position: Sitting, Cuff Size: Normal)   Pulse 77   Temp 97.7 F (36.5 C) (Oral)   Wt 167 lb (75.8 kg)   SpO2 100%   BMI 26.16 kg/m  Vitals:   01/04/23 1611  BP: (!) 163/88  Pulse: 77  Temp: 97.7 F (36.5 C)  TempSrc: Oral  SpO2: 100%  Weight: 167 lb (75.8 kg)     Physical Exam Vitals reviewed.  Constitutional:      General: She is not in acute distress.    Appearance: Normal appearance. She is well-developed. She is not diaphoretic.  HENT:     Head: Normocephalic and atraumatic.  Eyes:     General: No scleral icterus.    Conjunctiva/sclera: Conjunctivae normal.  Neck:     Thyroid: No thyromegaly.  Cardiovascular:     Rate and Rhythm: Normal rate and regular rhythm.     Pulses: Normal pulses.     Heart sounds: Normal heart sounds. No murmur heard. Pulmonary:     Effort: Pulmonary effort is normal. No respiratory distress.     Breath sounds: Normal breath sounds. No wheezing, rhonchi or rales.  Musculoskeletal:     Cervical back: Neck supple.     Right lower leg: No edema.     Left lower leg: No edema.  Lymphadenopathy:     Cervical: No cervical adenopathy.  Skin:    General: Skin is warm and dry.     Findings: No rash.  Neurological:     Mental Status: She is alert and oriented to person, place, and time. Mental status is at baseline.  Psychiatric:        Behavior: Behavior normal.        Thought Content: Thought content normal.        Judgment: Judgment normal.      No results found for any visits on 01/04/23.  Assessment & Plan     1. Type 2 diabetes mellitus with microalbuminuria, without long-term current use of insulin Diabetes mellitus without complication (HCC) Chronic  POCT A1C was 7.2 today Continue taking Metformin  BID  Continue measure her BS at home Continue to recommend balanced,  lower carb meals. Smaller meal size, adding snacks. Choosing water as drink of choice and increasing purposeful exercise. Advised to substitute ice cream on less sugary snack. Advised increased daily activities including walking  - POCT HgB A1C Will FU in 3 mo  2. Insomnia, unspecified type Chronic and stable Called in Ambien for next mo - zolpidem (AMBIEN) 5 MG tablet; Take 1 tablet (5 mg total) by mouth at bedtime as needed for sleep.  Dispense: 30 tablet; Refill: 0 Advised to refill with PCP  3. Elevated BP Last elevated BP was 146/81 on 08/17/22, pt was sick First reading today was 163/88 Second reading was 139/90 Per patient,  an elevation of BP associated with stress of getting to clinic on time Advised to have a trial of antihypertensive. Pt declined at this moment. Advised low-sodium diet and regular walking  No follow-ups on file. FU with PCP     The patient was advised to call back or seek an in-person evaluation if the symptoms worsen or if the condition fails to improve as anticipated.  I discussed the assessment and treatment plan with the patient. The patient was provided an opportunity to ask questions and all were answered. The patient agreed with the plan and demonstrated an understanding of the instructions.  I, Debera Lat, PA-C have reviewed all documentation for this visit. The documentation on  01/04/23  for the exam, diagnosis, procedures, and orders are all accurate and complete.  Debera Lat, [facility], MMS [facility] [phone] (phone) [phone] (fax)   [facility]

## 2023-01-11 ENCOUNTER — Other Ambulatory Visit: Payer: Self-pay | Admitting: Family Medicine

## 2023-01-11 NOTE — Telephone Encounter (Signed)
Medication Refill - Medication: OZEMPIC, 1 MG/DOSE, 4 MG/3ML SOPN  Has the patient contacted their pharmacy? Yes.   Ledi Sports coach) with Pharmacy states that pt medication was lost in the mail and is requesting another refill request for pt.  reference# [id number] Phone number: [phone] opt 2   Preferred Pharmacy (with phone number or street name):  [facility] - [place], [place] - [address] [facility] Phone: [phone]  Fax: [phone]     Has the patient been seen for an appointment in the last year OR does the patient have an upcoming appointment? Yes.    Agent: Please be advised that RX refills may take up to 3 business days. We ask that you follow-up with your pharmacy.

## 2023-01-12 ENCOUNTER — Telehealth: Payer: Self-pay | Admitting: Family Medicine

## 2023-01-12 MED ORDER — OZEMPIC (1 MG/DOSE) 4 MG/3ML ~~LOC~~ SOPN: PEN_INJECTOR | SUBCUTANEOUS | 1 refills | Status: DC

## 2023-01-12 NOTE — Telephone Encounter (Addendum)
Tiffany from [facility] calling in regards to the medication Ozempic 1 MG. Stated pt states they did not receive it when it was shipped out on 09/08/22. Pt is requesting a reshipment. Stated they need approval from the provider to reship.  REF# [id number]  Please advise.

## 2023-01-12 NOTE — Telephone Encounter (Signed)
Ledi [employer] coach) with Pharmacy states that pt medication was lost in the mail and is requesting another refill request for pt.  reference# [id number] Phone number: [phone] opt 2  Future visit in 1 month .  Requested Prescriptions  Pending Prescriptions Disp Refills   Semaglutide, 1 MG/DOSE, (OZEMPIC, 1 MG/DOSE,) 4 MG/3ML SOPN 9 mL 1     Endocrinology:  Diabetes - GLP-1 Receptor Agonists - semaglutide Failed - 01/11/2023 10:06 AM      Failed - HBA1C in normal range and within 180 days    Hemoglobin A1C  Date Value Ref Range Status  01/04/2023 7.2 (A) 4.0 - 5.6 % Final  02/06/2020 6.8  Final   Hgb A1c MFr Bld  Date Value Ref Range Status  01/28/2021 7.4 (H) 4.8 - 5.6 % Final    Comment:             Prediabetes: 5.7 - 6.4          Diabetes: >6.4          Glycemic control for adults with diabetes: <7.0          Passed - Cr in normal range and within 360 days    Creatinine, Ser  Date Value Ref Range Status  08/17/2022 0.66 0.57 - 1.00 mg/dL Final         Passed - Valid encounter within last 6 months    Recent Outpatient Visits           1 week ago Type 2 diabetes mellitus with microalbuminuria, without long-term current use of insulin (HCC)   [facility] [place], [place], PA-C   4 months ago Type 2 diabetes mellitus with microalbuminuria, without long-term current use of insulin [facility])   [place] [facility] Caro [place], DO   6 months ago Nausea   [facility] [place] Dense M, DO   11 months ago Type 2 diabetes mellitus with microalbuminuria, without long-term current use of insulin [facility])   [place] [facility] [place], Marzella Schlein, MD   1 year ago Type 2 diabetes mellitus with microalbuminuria, without long-term current use of insulin [facility])   [place] [facility] Bacigalupo, Marzella Schlein, MD       Future Appointments             In 1 month Bacigalupo,  Marzella Schlein, MD [facility], [facility]

## 2023-01-12 NOTE — Telephone Encounter (Signed)
Ok to reship

## 2023-02-04 ENCOUNTER — Other Ambulatory Visit: Payer: Self-pay | Admitting: Physician Assistant

## 2023-02-04 DIAGNOSIS — G4700 Insomnia, unspecified: Secondary | ICD-10-CM

## 2023-02-04 NOTE — Telephone Encounter (Signed)
Please advise 

## 2023-02-11 ENCOUNTER — Ambulatory Visit
Admission: RE | Admit: 2023-02-11 | Discharge: 2023-02-11 | Disposition: A | Discharge status: Home / Self Care | Payer: Medicare Other | Source: Ambulatory Visit | Attending: Family Medicine | Admitting: Family Medicine

## 2023-02-11 ENCOUNTER — Encounter: Payer: Self-pay | Admitting: Family Medicine

## 2023-02-11 ENCOUNTER — Ambulatory Visit (INDEPENDENT_AMBULATORY_CARE_PROVIDER_SITE_OTHER): Payer: Medicare Other | Admitting: Family Medicine

## 2023-02-11 VITALS — BP 153/86 | HR 85 | Resp 16 | Wt 163.4 lb

## 2023-02-11 DIAGNOSIS — M79672 Pain in left foot: Secondary | ICD-10-CM

## 2023-02-11 NOTE — Progress Notes (Signed)
Acute Office Visit  Subjective:     Patient ID: Raven Lewis, female    DOB: 15-Jul-1935, 88 y.o.   MRN: [id number]  Chief Complaint  Patient presents with   Foot Pain    HPI Left foot pain Patient is in today for 5 day history of left foot pain after slipping in her bathroom due a wet floor. She did not fall and did not turn her ankle. She did not hit her foot on anything. Monday night noted pain over the lateral foot, at the base of the fifth metatarsal, and swelling of the forefoot and lateral foot. Is taking Advil for pain management.   Has not had any changes in sensation or ROM.  Pain has not improved or worsened.     Review of Systems  Constitutional:  Negative for malaise/fatigue.  Musculoskeletal:  Negative for falls.       Left foot pain  Neurological:  Negative for dizziness, tingling, sensory change, loss of consciousness and weakness.        Objective:    BP (!) 153/86 (BP Location: Left Arm, Patient Position: Sitting, Cuff Size: Normal)   Pulse 85   Resp 16   Wt 163 lb 6.4 oz (74.1 kg)   BMI 25.59 kg/m    Physical Exam Constitutional:      General: She is not in acute distress. Cardiovascular:     Pulses: Normal pulses.          Dorsalis pedis pulses are 2+ on the right side and 2+ on the left side.       Posterior tibial pulses are 2+ on the right side and 2+ on the left side.  Musculoskeletal:     Right foot: Normal range of motion. No foot drop.     Left foot: Normal range of motion. No foot drop.  Feet:     Right foot:     Skin integrity: Skin integrity normal.     Toenail Condition: Right toenails are normal.     Left foot:     Skin integrity: Skin integrity normal.     Toenail Condition: Left toenails are normal.     Comments: Left foot: Bony tenderness and swelling at the base of the fifth metatarsal.  Skin:    General: Skin is warm and dry.     Capillary Refill: Capillary refill takes less than 2 seconds.  Neurological:     Mental  Status: She is alert.     Gait: Gait normal.     No results found for any visits on 02/11/23.      Assessment & Plan:   Problem List Items Addressed This Visit   None Visit Diagnoses     Left foot pain    -  Primary Lack of improvement in pain, patient age, and location of pain and swelling raise suspicion for fracture r break of the base of the 5th metatarsal. Given mechanism of injury also consider tendon injury.  Ordered xray of left foot. Consider referral to ortho depending on results.     Relevant Orders   DG Foot Complete Left     Encourage RICE, tylenol prn  No orders of the defined types were placed in this encounter.   No follow-ups on file.  Gilmer Mor, Medical Student   Patient seen along with MS3 student Ezekiel Slocumb. I personally evaluated this patient along with the student, and verified all aspects of the history, physical exam, and medical decision making as  documented by the student. I agree with the student's documentation and have made all necessary edits.  Lavine Hargrove, Marzella Schlein, MD, MPH [facility]

## 2023-02-15 NOTE — Progress Notes (Deleted)
      Established patient visit   Patient: Raven Lewis   DOB: 05-May-1935   88 y.o. Female  MRN: [id number] Visit Date: 02/16/2023  Today's healthcare provider: Shirlee Latch, MD   No chief complaint on file.  Subjective    HPI  ***  Medications: Outpatient Medications Prior to Visit  Medication Sig   Blood Glucose Monitoring Suppl (FREESTYLE LITE) w/Device KIT USE AS DIRECTED   FREESTYLE TEST STRIPS test strip TEST TID UTD   Lancets (FREESTYLE) lancets USE TID   metFORMIN (GLUCOPHAGE-XR) 500 MG 24 hr tablet TAKE 1 TABLET BY MOUTH TWICE DAILY   Multiple Vitamin (MULTIVITAMIN) tablet Take 1 tablet by mouth daily.   rosuvastatin (CRESTOR) 10 MG tablet Take 10 mg by mouth daily.   Semaglutide, 1 MG/DOSE, (OZEMPIC, 1 MG/DOSE,) 4 MG/3ML SOPN Inject 1 mg subcutaneously once a week into skin. Strength: 4 mg/3 ml   zolpidem (AMBIEN) 5 MG tablet TAKE 1 TABLET(5 MG) BY MOUTH AT BEDTIME AS NEEDED FOR SLEEP   No facility-administered medications prior to visit.    Review of Systems  {Labs  Heme  Chem  Endocrine  Serology  Results Review (optional):23779}   Objective    There were no vitals taken for this visit. {Show previous vital signs (optional):23777}  Physical Exam  ***  No results found for any visits on 02/16/23.  Assessment & Plan     ***  No follow-ups on file.      {provider attestation***:1}   Shirlee Latch, MD  [facility] [phone] (phone) [phone] (fax)  [facility]

## 2023-02-16 ENCOUNTER — Telehealth: Payer: Self-pay

## 2023-02-16 ENCOUNTER — Ambulatory Visit: Payer: Medicare Other | Admitting: Family Medicine

## 2023-02-16 DIAGNOSIS — M79672 Pain in left foot: Secondary | ICD-10-CM

## 2023-02-16 NOTE — Telephone Encounter (Signed)
-----   Message from Erasmo Downer, MD sent at 02/15/2023  3:40 PM EDT ----- Recommend Ortho referral for fracture at the base of the 2nd toe.

## 2023-02-16 NOTE — Telephone Encounter (Signed)
Copied from CRM [id number]. Topic: Referral - Request for Referral >> Feb 16, 2023  1:50 PM Dondra Prader E wrote: Has patient seen PCP for this complaint? No. *If NO, is insurance requiring patient see PCP for this issue before PCP can refer them? Referral for which specialty: Ortho Preferred provider/office: Highest recommended  Reason for referral: Has an injured foot. Pt has been sent for Xrays, something broken in the left foot. Missed her appt with PCP today because she could not find the building.

## 2023-02-16 NOTE — Telephone Encounter (Signed)
Referral placed.

## 2023-03-07 ENCOUNTER — Other Ambulatory Visit: Payer: Self-pay | Admitting: Family Medicine

## 2023-03-07 DIAGNOSIS — E1129 Type 2 diabetes mellitus with other diabetic kidney complication: Secondary | ICD-10-CM

## 2023-03-09 NOTE — Telephone Encounter (Signed)
Requested Prescriptions  Pending Prescriptions Disp Refills   FREESTYLE TEST STRIPS test strip [Pharmacy Med Name: FREESTYLE TEST STRIPS 50] 100 strip 2    Sig: TEST THREE TIMES DAILY AS DIRECTED     Endocrinology: Diabetes - Testing Supplies Passed - 03/07/2023 11:20 AM      Passed - Valid encounter within last 12 months    Recent Outpatient Visits           3 weeks ago Left foot pain   [place] [facility] [place], Marzella Schlein, MD   2 months ago Type 2 diabetes mellitus with microalbuminuria, without long-term current use of insulin [facility])   [place] [facility] [place], [place], PA-C   6 months ago Type 2 diabetes mellitus with microalbuminuria, without long-term current use of insulin [facility])   [place] [facility] [place] Dense M, DO   7 months ago Nausea   [facility] [place] Dense M, DO   1 year ago Type 2 diabetes mellitus with microalbuminuria, without long-term current use of insulin [facility])   [place] [facility] Bacigalupo, Marzella Schlein, MD       Future Appointments             In 1 week Bacigalupo, Marzella Schlein, MD [facility], [facility]

## 2023-03-14 ENCOUNTER — Other Ambulatory Visit: Payer: Self-pay | Admitting: Family Medicine

## 2023-03-16 ENCOUNTER — Encounter: Payer: Self-pay | Admitting: Family Medicine

## 2023-03-16 ENCOUNTER — Ambulatory Visit (INDEPENDENT_AMBULATORY_CARE_PROVIDER_SITE_OTHER): Payer: Medicare Other | Admitting: Family Medicine

## 2023-03-16 VITALS — BP 138/82 | HR 75 | Temp 97.6°F | Resp 13 | Ht 67.0 in | Wt 163.0 lb

## 2023-03-16 DIAGNOSIS — E1129 Type 2 diabetes mellitus with other diabetic kidney complication: Secondary | ICD-10-CM | POA: Diagnosis not present

## 2023-03-16 DIAGNOSIS — C434 Malignant melanoma of scalp and neck: Secondary | ICD-10-CM | POA: Diagnosis not present

## 2023-03-16 DIAGNOSIS — G4700 Insomnia, unspecified: Secondary | ICD-10-CM

## 2023-03-16 DIAGNOSIS — R809 Proteinuria, unspecified: Secondary | ICD-10-CM

## 2023-03-16 MED ORDER — ZOLPIDEM TARTRATE 5 MG PO TABS
5.0000 mg | ORAL_TABLET | Freq: Every evening | ORAL | 5 refills | Status: DC | PRN
Start: 2023-03-16 — End: 2023-09-02

## 2023-03-16 NOTE — Assessment & Plan Note (Signed)
Regular dermatology follow-ups with removal of suspicious lesions as needed. -Continue regular dermatology appointments.

## 2023-03-16 NOTE — Assessment & Plan Note (Signed)
Reported lethargy on lisinopril - no longer taking Continue to monitor UACR 

## 2023-03-16 NOTE — Progress Notes (Addendum)
Established Patient Office Visit  Subjective   Patient ID: Raven Lewis, female    DOB: 21-Jan-1935  Age: 88 y.o. MRN: [id number]  Chief Complaint  Patient presents with   Diabetes    Diabetes    Discussed the use of AI scribe software for clinical note transcription with the patient, who gave verbal consent to proceed.  History of Present Illness   The patient, with a history of diabetes and melanoma, presents for a routine follow-up. She reports sleep disturbances due to past trauma, which are managed with zolpidem (Ambien). The patient describes the medication as beneficial, allowing her to sleep without experiencing distressing memories. She reports waking up refreshed and ready to start the day.  The patient also mentions a recent episode of persistent dizziness, which she attributes to Crestor. After discontinuing the medication, the dizziness resolved.  Regarding her diabetes management, the patient is on metformin and Ozempic. She admits to not being very careful with her diet, often opting for convenience foods. However, she reports a morning blood glucose level of 140, which she considers to be good for her.  The patient also has a history of melanoma, for which she undergoes regular skin checks. She reports that any suspicious lesions are promptly removed.  Lastly, the patient mentions a recent broken toe, which occurred after a fall in her bathroom due to a water leak.        ROS    Objective:     BP 138/82 (BP Location: Left Arm, Patient Position: Sitting, Cuff Size: Normal)   Pulse 75   Temp 97.6 F (36.4 C) (Oral)   Resp 13   Ht 5\' 7"  (1.702 m)   Wt 163 lb (73.9 kg)   SpO2 99%   BMI 25.53 kg/m    Physical Exam Vitals reviewed.  Constitutional:      General: She is not in acute distress.    Appearance: Normal appearance. She is well-developed. She is not diaphoretic.  HENT:     Head: Normocephalic and atraumatic.  Eyes:     General: No scleral  icterus.    Conjunctiva/sclera: Conjunctivae normal.  Neck:     Thyroid: No thyromegaly.  Cardiovascular:     Rate and Rhythm: Normal rate and regular rhythm.     Pulses: Normal pulses.     Heart sounds: Normal heart sounds. No murmur heard. Pulmonary:     Effort: Pulmonary effort is normal. No respiratory distress.     Breath sounds: Normal breath sounds. No wheezing, rhonchi or rales.  Musculoskeletal:     Cervical back: Neck supple.     Right lower leg: No edema.     Left lower leg: No edema.  Lymphadenopathy:     Cervical: No cervical adenopathy.  Skin:    General: Skin is warm and dry.     Findings: No rash.  Neurological:     Mental Status: She is alert and oriented to person, place, and time. Mental status is at baseline.  Psychiatric:        Mood and Affect: Mood normal.        Behavior: Behavior normal.      No results found for any visits on 03/16/23.    The ASCVD Risk score (Arnett DK, et al., 2019) failed to calculate for the following reasons:   The 2019 ASCVD risk score is only valid for ages 101 to 22    Assessment & Plan:   Problem List Items Addressed This Visit  Endocrine   T2DM (type 2 diabetes mellitus) (HCC) - Primary    A1c of 7.2 in April, patient reports morning glucose of 140. Currently on Ozempic 1mg  weekly and Metformin 500mg  twice daily. Discussed that goal A1c for her age and health status is under 7.5. -Continue current regimen of Ozempic and Metformin. -Plan to recheck A1c at next visit in 6 months.      Relevant Orders   Urine Microalbumin w/creat. ratio   Microalbuminuria due to type 2 diabetes mellitus (HCC)    Reported lethargy on lisinopril - no longer taking Continue to monitor UACR      Relevant Orders   Urine Microalbumin w/creat. ratio     Other   Insomnia    Chronic use of Zolpidem (Ambien) for sleep, with reported good effect and no adverse reactions. Discussed the risks and benefits of continued use, including  potential memory loss and increased fall risk. Patient understands and wishes to continue. -Continue Zolpidem as needed for sleep.      Relevant Medications   zolpidem (AMBIEN) 5 MG tablet   Malignant melanoma of scalp (HCC)    Regular dermatology follow-ups with removal of suspicious lesions as needed. -Continue regular dermatology appointments.          Hyperlipidemia: Patient self-discontinued Crestor due to perceived dizziness. No dizziness since discontinuation. -No plan to restart Crestor at this time.  General Health Maintenance: -Perform annual diabetic foot exam today. -Collect annual urine sample for diabetes management today. -Schedule follow-up appointment in 6 months.        Return in about 6 months (around 09/16/2023) for chronic disease f/u.    Shirlee Latch, MD   Addendum: Patient did not fall. She stubbed her toe somehow.

## 2023-03-16 NOTE — Assessment & Plan Note (Signed)
Chronic use of Zolpidem (Ambien) for sleep, with reported good effect and no adverse reactions. Discussed the risks and benefits of continued use, including potential memory loss and increased fall risk. Patient understands and wishes to continue. -Continue Zolpidem as needed for sleep.

## 2023-03-16 NOTE — Assessment & Plan Note (Signed)
A1c of 7.2 in April, patient reports morning glucose of 140. Currently on Ozempic 1mg  weekly and Metformin 500mg  twice daily. Discussed that goal A1c for her age and health status is under 7.5. -Continue current regimen of Ozempic and Metformin. -Plan to recheck A1c at next visit in 6 months.

## 2023-04-06 ENCOUNTER — Encounter: Payer: Medicare Other | Admitting: Dermatology

## 2023-04-30 ENCOUNTER — Other Ambulatory Visit: Payer: Self-pay | Admitting: Family Medicine

## 2023-05-11 ENCOUNTER — Other Ambulatory Visit: Payer: Self-pay

## 2023-05-11 ENCOUNTER — Other Ambulatory Visit: Payer: Self-pay | Admitting: Family Medicine

## 2023-05-11 DIAGNOSIS — E1129 Type 2 diabetes mellitus with other diabetic kidney complication: Secondary | ICD-10-CM

## 2023-05-11 MED ORDER — FREESTYLE TEST VI STRP: ORAL_STRIP | 2 refills | Status: DC

## 2023-05-25 ENCOUNTER — Other Ambulatory Visit: Payer: Self-pay | Admitting: Family Medicine

## 2023-05-25 DIAGNOSIS — Z1231 Encounter for screening mammogram for malignant neoplasm of breast: Secondary | ICD-10-CM

## 2023-07-15 LAB — HM DIABETES EYE EXAM

## 2023-07-29 LAB — HEMOGLOBIN A1C: Hemoglobin A1C: 7.3

## 2023-08-04 ENCOUNTER — Ambulatory Visit
Admission: RE | Admit: 2023-08-04 | Discharge: 2023-08-04 | Disposition: A | Discharge status: Home / Self Care | Payer: Medicare Other | Source: Ambulatory Visit | Attending: Family Medicine | Admitting: Family Medicine

## 2023-08-04 DIAGNOSIS — Z1231 Encounter for screening mammogram for malignant neoplasm of breast: Secondary | ICD-10-CM | POA: Insufficient documentation

## 2023-08-31 ENCOUNTER — Ambulatory Visit: Payer: Medicare Other | Admitting: Family Medicine

## 2023-09-02 ENCOUNTER — Encounter: Payer: Self-pay | Admitting: Family Medicine

## 2023-09-02 ENCOUNTER — Ambulatory Visit: Payer: Medicare Other | Admitting: Family Medicine

## 2023-09-02 VITALS — BP 134/80 | HR 77 | Ht 68.0 in | Wt 174.0 lb

## 2023-09-02 DIAGNOSIS — G4700 Insomnia, unspecified: Secondary | ICD-10-CM

## 2023-09-02 DIAGNOSIS — R809 Proteinuria, unspecified: Secondary | ICD-10-CM | POA: Diagnosis not present

## 2023-09-02 DIAGNOSIS — Z711 Person with feared health complaint in whom no diagnosis is made: Secondary | ICD-10-CM | POA: Diagnosis not present

## 2023-09-02 DIAGNOSIS — E1129 Type 2 diabetes mellitus with other diabetic kidney complication: Secondary | ICD-10-CM | POA: Diagnosis not present

## 2023-09-02 MED ORDER — OZEMPIC (1 MG/DOSE) 4 MG/3ML ~~LOC~~ SOPN: PEN_INJECTOR | SUBCUTANEOUS | 1 refills | Status: DC

## 2023-09-02 MED ORDER — ZOLPIDEM TARTRATE 5 MG PO TABS: 5.0000 mg | ORAL_TABLET | Freq: Every evening | ORAL | 5 refills | Status: DC | PRN

## 2023-09-02 MED ORDER — METFORMIN HCL ER 500 MG PO TB24: ORAL_TABLET | ORAL | 1 refills | Status: DC

## 2023-09-02 NOTE — Progress Notes (Signed)
Established patient visit   Patient: Raven Lewis   DOB: [birth year]   88 y.o. Female  MRN: [id number] Visit Date: 09/02/2023  Today's healthcare provider: Shirlee Latch, MD   Chief Complaint  Patient presents with   Medical Management of Chronic Issues   Subjective    HPI   Discussed the use of AI scribe software for clinical note transcription with the patient, who gave verbal consent to proceed.  History of Present Illness   The patient, with a past medical history of skin cancer, heart issues, and diabetes, presents with concerns about memory issues and getting lost easily. She reports that these issues are not new and have been a long-standing problem. The patient also expresses frustration with her diabetes management, stating that she has been off Ozempic for a few weeks and has been taking metformin once a day instead of twice a day. She also mentions that she has been managing her sleep with zolpidem, which she finds very helpful. The patient expresses a desire to manage her health issues with her primary care provider, rather than being referred to multiple specialists.         Medications: Outpatient Medications Prior to Visit  Medication Sig   Blood Glucose Monitoring Suppl (FREESTYLE LITE) w/Device KIT USE AS DIRECTED   FREESTYLE TEST STRIPS test strip TEST THREE TIMES DAILY AS DIRECTED   Lancets (FREESTYLE) lancets TEST THREE TIMES DAILY   Multiple Vitamin (MULTIVITAMIN) tablet Take 1 tablet by mouth daily.   [DISCONTINUED] metFORMIN (GLUCOPHAGE-XR) 500 MG 24 hr tablet TAKE 1 TABLET BY MOUTH TWICE DAILY   [DISCONTINUED] zolpidem (AMBIEN) 5 MG tablet Take 1 tablet (5 mg total) by mouth at bedtime as needed for sleep.   [DISCONTINUED] Semaglutide, 1 MG/DOSE, (OZEMPIC, 1 MG/DOSE,) 4 MG/3ML SOPN INJECT 1MG  SUBCUTANEOUSLY  ONCE A WEEK (Patient not taking: Reported on 09/02/2023)   No facility-administered medications prior to visit.    Review of  Systems     Objective    BP 134/80 (BP Location: Left Arm, Patient Position: Sitting, Cuff Size: Normal)   Pulse 77   Ht 5\' 8"  (1.727 m)   Wt 174 lb (78.9 kg)   SpO2 100%   BMI 26.46 kg/m    Physical Exam Vitals reviewed.  Constitutional:      General: She is not in acute distress.    Appearance: Normal appearance. She is well-developed. She is not diaphoretic.  HENT:     Head: Normocephalic and atraumatic.  Eyes:     General: No scleral icterus.    Conjunctiva/sclera: Conjunctivae normal.  Neck:     Thyroid: No thyromegaly.  Cardiovascular:     Rate and Rhythm: Normal rate and regular rhythm.     Pulses: Normal pulses.     Heart sounds: Normal heart sounds. No murmur heard. Pulmonary:     Effort: Pulmonary effort is normal. No respiratory distress.     Breath sounds: Normal breath sounds. No wheezing, rhonchi or rales.  Musculoskeletal:     Cervical back: Neck supple.     Right lower leg: No edema.     Left lower leg: No edema.  Lymphadenopathy:     Cervical: No cervical adenopathy.  Skin:    General: Skin is warm and dry.     Findings: No rash.  Neurological:     Mental Status: She is alert and oriented to person, place, and time. Mental status is at baseline.  Psychiatric:  Mood and Affect: Mood normal.        Behavior: Behavior normal.      Results for orders placed or performed in visit on 09/02/23  Hemoglobin A1c  Result Value Ref Range   Hemoglobin A1C 7.3     Assessment & Plan     Problem List Items Addressed This Visit       Endocrine   T2DM (type 2 diabetes mellitus) (HCC) - Primary   A1c increased to 7.5% after discontinuing Ozempic. Previously managed with Ozempic and metformin. Prefers primary care management over multiple specialist referrals. Discussed importance of maintaining A1c below 7.5% and benefits of resuming Ozempic. No significant side effects from metformin.   - Restart Ozempic   - Continue metformin twice daily   -  Follow up in three months to reassess A1c        Relevant Medications   metFORMIN (GLUCOPHAGE-XR) 500 MG 24 hr tablet   Semaglutide, 1 MG/DOSE, (OZEMPIC, 1 MG/DOSE,) 4 MG/3ML SOPN   Other Relevant Orders   Microalbumin / creatinine urine ratio   Microalbuminuria due to type 2 diabetes mellitus (HCC)   Reported lethargy on lisinopril - no longer taking Continue to monitor UACR      Relevant Medications   metFORMIN (GLUCOPHAGE-XR) 500 MG 24 hr tablet   Semaglutide, 1 MG/DOSE, (OZEMPIC, 1 MG/DOSE,) 4 MG/3ML SOPN   Other Relevant Orders   Microalbumin / creatinine urine ratio     Other   Insomnia   Chronic use of Zolpidem (Ambien) for sleep, with reported good effect and no adverse reactions. Discussed the risks and benefits of continued use, including potential memory loss and increased fall risk. Patient understands and wishes to continue. -Continue Zolpidem as needed for sleep.      Relevant Medications   zolpidem (AMBIEN) 5 MG tablet   Other Visit Diagnoses       Concern about memory               Memory Concerns   Reports getting lost and needing to write down directions. Completed memory test today, which she passed. Manages household tasks and finances independently but lacks social interaction, possibly contributing to perceived memory issues.   - Reassess memory in future visits if concerns persist    General Health Maintenance   Received flu shot last month. Uncertain about RSV and COVID booster vaccinations. Pneumonia, shingles, and tetanus vaccinations are up to date. Discussed importance of RSV and COVID boosters given age and comorbidities.   - Check with pharmacy regarding RSV and COVID booster vaccinations   - Consider getting COVID booster if not already received    Follow-up   - Cancel January appointment   - Schedule follow-up appointment in three months.        Total time spent on today's visit was greater than 40 minutes, including both  face-to-face time and nonface-to-face time personally spent on review of chart (labs and imaging), discussing labs and goals, discussing further work-up, treatment options, reviewing outside records, answering patient's questions, and coordinating care.   Return in about 6 months (around 03/02/2024) for chronic disease f/u, cancel January with me.       Shirlee Latch, MD  [facility] [phone] (phone) [phone] (fax)  [facility]

## 2023-09-03 LAB — MICROALBUMIN / CREATININE URINE RATIO
Creatinine, Urine: 71 mg/dL
Microalb/Creat Ratio: 30 mg/g{creat} — ABNORMAL HIGH (ref 0–29)
Microalbumin, Urine: 21.5 ug/mL

## 2023-09-03 LAB — SPECIMEN STATUS REPORT

## 2023-09-03 NOTE — Assessment & Plan Note (Signed)
Chronic use of Zolpidem (Ambien) for sleep, with reported good effect and no adverse reactions. Discussed the risks and benefits of continued use, including potential memory loss and increased fall risk. Patient understands and wishes to continue. -Continue Zolpidem as needed for sleep.

## 2023-09-03 NOTE — Assessment & Plan Note (Signed)
Reported lethargy on lisinopril - no longer taking Continue to monitor UACR 

## 2023-09-03 NOTE — Assessment & Plan Note (Signed)
A1c increased to 7.5% after discontinuing Ozempic. Previously managed with Ozempic and metformin. Prefers primary care management over multiple specialist referrals. Discussed importance of maintaining A1c below 7.5% and benefits of resuming Ozempic. No significant side effects from metformin.   - Restart Ozempic   - Continue metformin twice daily   - Follow up in three months to reassess A1c

## 2023-09-14 ENCOUNTER — Ambulatory Visit: Payer: Medicare Other

## 2023-09-14 DIAGNOSIS — Z0000 Encounter for general adult medical examination without abnormal findings: Secondary | ICD-10-CM

## 2023-09-14 NOTE — Patient Instructions (Addendum)
 Ms. Lei , Thank you for taking time to come for your Medicare Wellness Visit. I appreciate your ongoing commitment to your health goals. Please review the following plan we discussed and let me know if I can assist you in the future.   Referrals/Orders/Follow-Ups/Clinician Recommendations: NONE  This is a list of the screening recommended for you and due dates:  Health Maintenance  Topic Date Due   DEXA scan (bone density measurement)  Never done   DTaP/Tdap/Td vaccine (1 - Tdap) 06/10/2021   COVID-19 Vaccine (5 - 2024-25 season) 05/16/2023   Eye exam for diabetics  07/08/2023   Hemoglobin A1C  01/26/2024   Complete foot exam   03/15/2024   Medicare Annual Wellness Visit  09/13/2024   Pneumonia Vaccine  Completed   Flu Shot  Completed   Zoster (Shingles) Vaccine  Completed   HPV Vaccine  Aged Out    Advanced directives: (ACP Link)Information on Advanced Care Planning can be found at [place]  Secretary of State Advance Health Care Directives Advance Health Care Directives ([email or web address]   Next Medicare Annual Wellness Visit scheduled for next year: Yes    09/20/24 @ 9:30 AM BY VIDEO

## 2023-09-14 NOTE — Progress Notes (Signed)
 Subjective:   Raven Lewis is a 88 y.o. female who presents for Medicare Annual (Subsequent) preventive examination.  Visit Complete: Virtual I connected with  Raven Lewis on 09/14/23 by a audio enabled telemedicine application and verified that I am speaking with the correct person using two identifiers.  Patient Location: Home  Provider Location: Office/Clinic  I discussed the limitations of evaluation and management by telemedicine. The patient expressed understanding and agreed to proceed.  Vital Signs: Because this visit was a virtual/telehealth visit, some criteria may be missing or patient reported. Any vitals not documented were not able to be obtained and vitals that have been documented are patient reported.  Cardiac Risk Factors include: advanced age (>43men, >28 women);microalbuminuria;diabetes mellitus;sedentary lifestyle     Objective:    Today's Vitals   09/14/23 0920  PainSc: 3    There is no height or weight on file to calculate BMI.     09/14/2023    9:28 AM 08/31/2022   11:36 AM 08/28/2021    1:48 PM 02/20/2019    1:44 PM  Advanced Directives  Does Patient Have a Medical Advance Directive? No No No Yes  Type of Advance Directive    Living will;Healthcare Power of Attorney  Does patient want to make changes to medical advance directive?    No - Patient declined  Copy of Healthcare Power of Attorney in Chart?    No - copy requested  Would patient like information on creating a medical advance directive? No - Patient declined No - Patient declined No - Patient declined     Current Medications (verified) Outpatient Encounter Medications as of 09/14/2023  Medication Sig   Blood Glucose Monitoring Suppl (FREESTYLE LITE) w/Device KIT USE AS DIRECTED   FREESTYLE TEST STRIPS test strip TEST THREE TIMES DAILY AS DIRECTED   Lancets (FREESTYLE) lancets TEST THREE TIMES DAILY   metFORMIN  (GLUCOPHAGE -XR) 500 MG 24 hr tablet TAKE 1 TABLET BY MOUTH TWICE DAILY    Multiple Vitamin (MULTIVITAMIN) tablet Take 1 tablet by mouth daily.   Semaglutide , 1 MG/DOSE, (OZEMPIC , 1 MG/DOSE,) 4 MG/3ML SOPN INJECT 1MG  SUBCUTANEOUSLY  ONCE A WEEK   zolpidem  (AMBIEN ) 5 MG tablet Take 1 tablet (5 mg total) by mouth at bedtime as needed for sleep.   No facility-administered encounter medications on file as of 09/14/2023.    Allergies (verified) Patient has no known allergies.   History: Past Medical History:  Diagnosis Date   Allergic rhinitis    Cataract    Diabetes mellitus without complication (HCC)    Insomnia    Melanoma (HCC) 10/16/2020   Breslow's 1.79mm. Level III-IV. Tx at [facility]   Melanoma [facility]) 10/08/2021   Left upper arm. Superficial spreading. Breslow's 0.7mm. Clark's level II   MRSA (methicillin resistant Staphylococcus aureus) 2000   right elbow   Thrombocytopenia (HCC)    Thrombocytopenia, idiopathic (HCC) 12/20/2018   Past Surgical History:  Procedure Laterality Date   CATARACT EXTRACTION Left    ELBOW SURGERY     MRSA in right elbow   TUBAL LIGATION     WRIST SURGERY     left wrist   Family History  Problem Relation Age of Onset   Breast cancer Mother 25   Heart disease Sister        heart failure   Lung cancer Brother    Heart disease Sister    Colon cancer Neg Hx    Social History   Socioeconomic History   Marital status: Widowed  Spouse name: Not on file   Number of children: 2   Years of education: Not on file   Highest education level: 12th grade  Occupational History   Occupation: retired  Tobacco Use   Smoking status: Former    Current packs/day: 0.00    Average packs/day: 1.5 packs/day for 40.0 years (60.0 ttl pk-yrs)    Types: Cigarettes    Start date: 11/29/1960    Quit date: 11/29/2000    Years since quitting: 22.8   Smokeless tobacco: Never  Vaping Use   Vaping status: Never Used  Substance and Sexual Activity   Alcohol use: Not Currently    Alcohol/week: 1.0 standard drink of alcohol     Types: 1 Glasses of wine per week    Comment: occasionally, less than weekly   Drug use: Never   Sexual activity: Not Currently  Other Topics Concern   Not on file  Social History Narrative   Not on file   Social Drivers of Health   Financial Resource Strain: Low Risk  (09/14/2023)   Overall Financial Resource Strain (CARDIA)    Difficulty of Paying Living Expenses: Not hard at all  Food Insecurity: No Food Insecurity (09/14/2023)   Hunger Vital Sign    Worried About Running Out of Food in the Last Year: Never true    Ran Out of Food in the Last Year: Never true  Transportation Needs: No Transportation Needs (09/14/2023)   PRAPARE - Administrator, Civil Service (Medical): No    Lack of Transportation (Non-Medical): No  Physical Activity: Inactive (09/14/2023)   Exercise Vital Sign    Days of Exercise per Week: 0 days    Minutes of Exercise per Session: 0 min  Stress: No Stress Concern Present (09/14/2023)   [employer] of Occupational Health - Occupational Stress Questionnaire    Feeling of Stress : Only a little  Social Connections: Moderately Isolated (09/14/2023)   Social Connection and Isolation Panel [NHANES]    Frequency of Communication with Friends and Family: More than three times a week    Frequency of Social Gatherings with Friends and Family: Once a week    Attends Religious Services: Never    Database Administrator or Organizations: Yes    Attends Engineer, Structural: More than 4 times per year    Marital Status: Widowed    Tobacco Counseling Counseling given: Not Answered   Clinical Intake:  Pre-visit preparation completed: Yes  Pain : 0-10 Pain Score: 3  Pain Type: Chronic pain Pain Location: Back Pain Orientation: Mid Pain Descriptors / Indicators: Aching Pain Onset: More than a month ago Pain Frequency: Intermittent Pain Relieving Factors: ADVIL, HOT SHOWER  Pain Relieving Factors: ADVIL, HOT SHOWER  BMI -  recorded: 26.5 Nutritional Status: BMI 25 -29 Overweight Nutritional Risks: None Diabetes: Yes CBG done?: No Did pt. bring in CBG monitor from home?: No  How often do you need to have someone help you when you read instructions, pamphlets, or other written materials from your doctor or pharmacy?: 1 - Never  Interpreter Needed?: No  Information entered by :: JHONNIE DAS, LPN   Activities of Daily Living    09/14/2023    9:38 AM 01/04/2023    4:14 PM  In your present state of health, do you have any difficulty performing the following activities:  Hearing? 0 0  Vision? 0 0  Difficulty concentrating or making decisions? 0 0  Walking or climbing stairs? 0 0  Dressing or bathing? 0 0  Doing errands, shopping? 0 0  Preparing Food and eating ? N   Using the Toilet? N   In the past six months, have you accidently leaked urine? N   Do you have problems with loss of bowel control? N   Managing your Medications? N   Managing your Finances? N   Housekeeping or managing your Housekeeping? N     Patient Care Team: Myrla Jon HERO, MD as PCP - General (Family Medicine) [facility], [facility]  Indicate any recent Medical Services you may have received from other than [facility] providers in the past year (date may be approximate).     Assessment:   This is a routine wellness examination for Raven Lewis.  Hearing/Vision screen Hearing Screening - Comments:: NO AIDS Vision Screening - Comments:: READERS- [facility]    Goals Addressed             This Visit's Progress    DIET - INCREASE WATER INTAKE         Depression Screen    09/14/2023    9:26 AM 02/11/2023    1:05 PM 01/04/2023    4:14 PM 08/31/2022   11:33 AM 07/15/2022   11:35 AM 02/13/2022   11:03 AM 08/28/2021    1:50 PM  PHQ 2/9 Scores  PHQ - 2 Score 0 0 0 0 0 0 0  PHQ- 9 Score 0 0 3 0 0 1     Fall Risk    09/14/2023    9:38 AM 02/11/2023    1:05 PM 01/04/2023    4:14 PM 08/31/2022   11:38 AM 07/15/2022    11:35 AM  Fall Risk   Falls in the past year? 0 0 0 0 0  Number falls in past yr: 0 0 0 0 0  Injury with Fall? 0 0 0 0 0  Risk for fall due to : No Fall Risks No Fall Risks  No Fall Risks No Fall Risks  Follow up Falls prevention discussed;Falls evaluation completed   Falls prevention discussed;Falls evaluation completed Falls evaluation completed    MEDICARE RISK AT HOME: Medicare Risk at Home Any stairs in or around the home?: No If so, are there any without handrails?: No Home free of loose throw rugs in walkways, pet beds, electrical cords, etc?: Yes Adequate lighting in your home to reduce risk of falls?: Yes Life alert?: No Use of a cane, walker or w/c?: No Grab bars in the bathroom?: No Shower chair or bench in shower?: Yes Elevated toilet seat or a handicapped toilet?: Yes  TIMED UP AND GO:  Was the test performed?  No    Cognitive Function:    09/02/2023    2:46 PM  MMSE - Mini Mental State Exam  Orientation to time 5  Orientation to Place 5  Registration 3  Attention/ Calculation 5  Recall 3  Language- name 2 objects 2  Language- repeat 1  Language- follow 3 step command 3  Language- read & follow direction 1  Write a sentence 1  Copy design 1  Total score 30        09/14/2023    9:39 AM 08/31/2022   11:41 AM  6CIT Screen  What Year? 0 points 0 points  What month? 0 points 0 points  What time? 0 points 0 points  Count back from 20 0 points 0 points  Months in reverse 0 points 0 points  Repeat phrase 0 points  0 points  Total Score 0 points 0 points    Immunizations Immunization History  Administered Date(s) Administered   Fluad Quad(high Dose 65+) 07/15/2021   Influenza Inj Mdck Quad Pf 06/20/2018, 06/26/2019   Influenza,inj,Quad PF,6+ Mos 06/26/2019, 06/12/2020   Influenza-Unspecified 08/03/2022, 08/02/2023   PFIZER(Purple Top)SARS-COV-2 Vaccination 09/25/2019, 10/16/2019, 06/27/2020   Pneumococcal Conjugate-13 08/31/2013   Pneumococcal  Polysaccharide-23 10/16/2003   Td (Adult),unspecified 06/09/2021   Unspecified SARS-COV-2 Vaccination 07/22/2022   Zoster Recombinant(Shingrix) 10/20/2017, 12/18/2017   Zoster, Live 05/08/2014    TDAP status: Up to date  Flu Vaccine status: Up to date  Pneumococcal vaccine status: Due, Education has been provided regarding the importance of this vaccine. Advised may receive this vaccine at local pharmacy or Health Dept. Aware to provide a copy of the vaccination record if obtained from local pharmacy or Health Dept. Verbalized acceptance and understanding.  Covid-19 vaccine status: Completed vaccines  Qualifies for Shingles Vaccine? Yes   Zostavax completed Yes   Shingrix Completed?: Yes  Screening Tests Health Maintenance  Topic Date Due   DEXA SCAN  Never done   DTaP/Tdap/Td (1 - Tdap) 06/10/2021   COVID-19 Vaccine (5 - 2024-25 season) 05/16/2023   OPHTHALMOLOGY EXAM  07/08/2023   HEMOGLOBIN A1C  01/26/2024   FOOT EXAM  03/15/2024   Medicare Annual Wellness (AWV)  09/13/2024   Pneumonia Vaccine 48+ Years old  Completed   INFLUENZA VACCINE  Completed   Zoster Vaccines- Shingrix  Completed   HPV VACCINES  Aged Out    Health Maintenance  Health Maintenance Due  Topic Date Due   DEXA SCAN  Never done   DTaP/Tdap/Td (1 - Tdap) 06/10/2021   COVID-19 Vaccine (5 - 2024-25 season) 05/16/2023   OPHTHALMOLOGY EXAM  07/08/2023    Colorectal cancer screening: No longer required.   Mammogram status: No longer required due to AGE.  AGED OUT BDS  Lung Cancer Screening: (Low Dose CT Chest recommended if Age 40-80 years, 20 pack-year currently smoking OR have quit w/in 15years.) does not qualify.    Additional Screening:  Hepatitis C Screening: does not qualify; Completed NO  Vision Screening: Recommended annual ophthalmology exams for early detection of glaucoma and other disorders of the eye. Is the patient up to date with their annual eye exam?  Yes  Who is the provider  or what is the name of the office in which the patient attends annual eye exams? [facility] If pt is not established with a provider, would they like to be referred to a provider to establish care? No .   Dental Screening: Recommended annual dental exams for proper oral hygiene  Diabetic Foot Exam: Diabetic Foot Exam: Completed 03/16/23  Community Resource Referral / Chronic Care Management: CRR required this visit?  No   CCM required this visit?  No     Plan:     I have personally reviewed and noted the following in the patient's chart:   Medical and social history Use of alcohol, tobacco or illicit drugs  Current medications and supplements including opioid prescriptions. Patient is not currently taking opioid prescriptions. Functional ability and status Nutritional status Physical activity Advanced directives List of other physicians Hospitalizations, surgeries, and ER visits in previous 12 months Vitals Screenings to include cognitive, depression, and falls Referrals and appointments  In addition, I have reviewed and discussed with patient certain preventive protocols, quality metrics, and best practice recommendations. A written personalized care plan for preventive services as well as general preventive health recommendations  were provided to patient.     Jhonnie GORMAN Das, LPN   [id number]   After Visit Summary: (MyChart) Due to this being a telephonic visit, the after visit summary with patients personalized plan was offered to patient via MyChart   Nurse Notes: NONE

## 2023-09-16 ENCOUNTER — Ambulatory Visit: Payer: Medicare Other | Admitting: Family Medicine

## 2023-09-24 ENCOUNTER — Ambulatory Visit: Payer: Self-pay | Admitting: *Deleted

## 2023-09-24 NOTE — Telephone Encounter (Signed)
 Reason for Disposition  Age > 60 years  Answer Assessment - Initial Assessment Questions 1. LOCATION: Where does it hurt?      Joyce bulot, daughter calling.  She called the office earlier this week and gcould not get an appt. She was having abd pain and diarrhea.     That's better. She is not eating now.    She's afraid to eat.    I'm trying to get her seen.   It's intermittent now the stomach pain and diarrhea.    She is taking Imodium and helped short term.     I'm a runner, broadcasting/film/video.   Just got out of school and called. 2. RADIATION: Does the pain shoot anywhere else? (e.g., chest, back)     She's having abd pain.   She's afraid she's going to have diarrhea again.   She did eat a muffin this morning and kept it down.   She's a cancer pt.   Her PET scan last showed something with her stomach.    3. ONSET: When did the pain begin? (e.g., minutes, hours or days ago)      A week     She called Wed. The office did not have any appts.    The urgent cares are closing early due to the snow event coming in. 4. SUDDEN: Gradual or sudden onset?     Not asked 5. PATTERN Does the pain come and go, or is it constant?    - If it comes and goes: How long does it last? Do you have pain now?     (Note: Comes and goes means the pain is intermittent. It goes away completely between bouts.)    - If constant: Is it getting better, staying the same, or getting worse?      (Note: Constant means the pain never goes away completely; most serious pain is constant and gets worse.)      It's intermittent abd pain. No vomiting.    It's going on for a week.    She lives alone and hasn't been out anywhere. 6. SEVERITY: How bad is the pain?  (e.g., Scale 1-10; mild, moderate, or severe)    - MILD (1-3): Doesn't interfere with normal activities, abdomen soft and not tender to touch.     - MODERATE (4-7): Interferes with normal activities or awakens from sleep, abdomen tender to touch.     - SEVERE (8-10):  Excruciating pain, doubled over, unable to do any normal activities.       Moderate. 7. RECURRENT SYMPTOM: Have you ever had this type of stomach pain before? If Yes, ask: When was the last time? and What happened that time?      Not asked 8. CAUSE: What do you think is causing the stomach pain?     Not sure 9. RELIEVING/AGGRAVATING FACTORS: What makes it better or worse? (e.g., antacids, bending or twisting motion, bowel movement)     Eating and fear of eating due to causing diarrhea 10. OTHER SYMPTOMS: Do you have any other symptoms? (e.g., back pain, diarrhea, fever, urination pain, vomiting)       No fever.     Loss of appetite, pain and weakness are main issues right now. 11. PREGNANCY: Is there any chance you are pregnant? When was your last menstrual period?       N/A due to age  Protocols used: Abdominal Pain - [facility]

## 2023-09-24 NOTE — Telephone Encounter (Signed)
  Chief Complaint: Abd pain and diarrhea for a week.   Daughter [name], calling in for an appt.   Not with her mother presently.    Snow event coming in so practice closing at 3:00 today.   No appts available today. Symptoms: Intermittent abd pain and diarrhea.   Poor appetite and weakness.   Been sick for a week.    Frequency: For a week though better now.   Intermittently hurting and having diarrhea instead of daily. Pertinent Negatives: Patient denies fever   No vomiting Disposition: [] ED /[] Urgent Care (no appt availability in office) / [x] Appointment(In office/virtual)/ []  [facility]/ [] Home Care/ [] Refused Recommended Disposition /[] [facility]/ []  Follow-up with PCP Additional Notes: Made her an appt for MOnday 09/27/2023 at 10:20 with Curtis Boom, NP.    Instructed daughter to take her mother to the ED if her symptoms became worse.   [name] mentioned she has called several urgent cares and they are closing early too due to the snow event.

## 2023-09-27 ENCOUNTER — Encounter: Payer: Self-pay | Admitting: Family Medicine

## 2023-09-27 ENCOUNTER — Ambulatory Visit (INDEPENDENT_AMBULATORY_CARE_PROVIDER_SITE_OTHER): Payer: Medicare Other | Admitting: Family Medicine

## 2023-09-27 VITALS — BP 152/85 | HR 86 | Ht 68.0 in | Wt 164.9 lb

## 2023-09-27 DIAGNOSIS — R809 Proteinuria, unspecified: Secondary | ICD-10-CM

## 2023-09-27 DIAGNOSIS — R030 Elevated blood-pressure reading, without diagnosis of hypertension: Secondary | ICD-10-CM

## 2023-09-27 DIAGNOSIS — E1129 Type 2 diabetes mellitus with other diabetic kidney complication: Secondary | ICD-10-CM

## 2023-09-27 DIAGNOSIS — R197 Diarrhea, unspecified: Secondary | ICD-10-CM

## 2023-09-27 DIAGNOSIS — Z7984 Long term (current) use of oral hypoglycemic drugs: Secondary | ICD-10-CM

## 2023-09-27 NOTE — Assessment & Plan Note (Addendum)
 BP elevated during office visit, 152/85 No hx of HTN, does not monitor at home - Recommend purchasing at home cuff, upper arm automatic monitor x3 weekly - Given age - Goal SBP<140; DBP<80 - low sodium diet and exercise as tolerated - increase daily walking - f/u 4-6 weeks for BP check

## 2023-09-27 NOTE — Assessment & Plan Note (Addendum)
 Controlled with Metformin and Ozempic. Hemoglobin A1c is 7.3 on 07/29/23. -Decrease metformin to 500mg  once daily. -Continue Ozempic 1mg  weekly subq. -Check A1c in one month; goal <7.5 - Small frequent meals, increase daily protein.

## 2023-09-27 NOTE — Assessment & Plan Note (Addendum)
 Daily, afternoon/hs episodes for the past two weeks, sometimes with urgency leading to incontinence. No associated fever, abdominal pain, or blood in stool. Stool formed in morning.  History of diverticulosis noted on a recent PET scan. Does not appear infectious nature. Possible medication side effect from Metformin  and Ozempic . -Reduce Metformin  500mg  to once daily. -Check CMP to assess electrolyte balance. -Advise on hydration and electrolyte replenishment with Pedialyte. -Advise on dietary changes (BRAT diet, increased fiber). -Consider over-the-counter Pepcid or Prilosec for belching -GI referral for further evaluation given hx of diverticulosis, new onset diarrhea, and no  hx of colon CA screening.

## 2023-09-27 NOTE — Progress Notes (Signed)
 Acute Office Visit  Introduced to nurse practitioner role and practice setting.  All questions answered.  Discussed provider/patient relationship and expectations.   Subjective:     Patient ID: Raven Lewis, female    DOB: 08/22/1935, 89 y.o.   MRN: [id number]  Chief Complaint  Patient presents with   Abdominal Pain    Associated with diarrhea X 1 week ago as of Friday. Patient reports she has no control. Patient reports she can keep her food down, with minor nausea. Patient daughter reports she took imodium Friday January 3rd    Pt presents with 10 days of diarrhea occurring once or twice daily, typically in the afternoon or evening. The patient describes the diarrhea as urgent, often not making it to the bathroom in time, and occurring within half an hour of eating. The stool is described as loose but not liquid, and brown in color. There is no associated abdominal pain, fever, or blood in the stool. The patient also reports frequent belching and some fatigue. Took imodium once. Stools are formed otherwise in morning. Denies recent infections or fevers.  States diverticulosis, identified on a recent PET scan in November. The patient also has a history of diabetes, managed with metformin  and Ozempic . The patient has been on metformin  for several years and recently restarted Ozempic  in December.  The patient's diet is described consisting of soup, broccoli, and fruit. States not that well balanced as it could be.   The patient denies any known food allergies, except for a sensitivity to dairy, and reports no changes in diet. The patient also denies any palpitations or other symptoms suggestive of electrolyte imbalance.  The patient has never had a colonoscopy and is due for a follow-up PET scan next month.     Review of Systems  Constitutional:  Positive for malaise/fatigue. Negative for chills, fever and weight loss.  Respiratory: Negative.    Cardiovascular: Negative.    Gastrointestinal:  Positive for diarrhea and nausea. Negative for abdominal pain, blood in stool, constipation, heartburn, melena and vomiting.       Belching  Musculoskeletal: Negative.        Objective:    BP (!) 152/85   Pulse 86   Ht 5' 8 (1.727 m)   Wt 164 lb 14.4 oz (74.8 kg)   SpO2 99%   BMI 25.07 kg/m    Physical Exam Constitutional:      Appearance: She is well-developed and normal weight.  HENT:     Head: Normocephalic.     Mouth/Throat:     Mouth: Mucous membranes are moist.  Eyes:     Extraocular Movements: Extraocular movements intact.     Pupils: Pupils are equal, round, and reactive to light.  Cardiovascular:     Rate and Rhythm: Normal rate and regular rhythm.     Heart sounds: Normal heart sounds. No murmur heard.    No friction rub. No gallop.  Pulmonary:     Effort: Pulmonary effort is normal. No respiratory distress.     Breath sounds: Normal breath sounds. No stridor. No wheezing, rhonchi or rales.  Chest:     Chest wall: No tenderness.  Abdominal:     General: Abdomen is flat. Bowel sounds are normal. There is no distension or abdominal bruit. There are no signs of injury.     Palpations: Abdomen is soft. There is no fluid wave, hepatomegaly, splenomegaly, mass or pulsatile mass.     Tenderness: There is no abdominal tenderness. There is  no right CVA tenderness, left CVA tenderness, guarding or rebound.     Hernia: No hernia is present.  Skin:    General: Skin is warm.     Capillary Refill: Capillary refill takes less than 2 seconds.  Neurological:     General: No focal deficit present.     Mental Status: She is alert and oriented to person, place, and time.     Cranial Nerves: No cranial nerve deficit.     Motor: No weakness.  Psychiatric:        Attention and Perception: Attention and perception normal.        Mood and Affect: Mood normal.        Speech: Speech normal.        Behavior: Behavior normal. Behavior is cooperative.         Thought Content: Thought content normal.        Cognition and Memory: Cognition normal. She exhibits impaired recent memory.        Judgment: Judgment normal.    No results found for any visits on 09/27/23.     Assessment & Plan:   Problem List Items Addressed This Visit       Endocrine   T2DM (type 2 diabetes mellitus) (HCC)   Controlled with Metformin  and Ozempic . Hemoglobin A1c is 7.3 on 07/29/23. -Decrease metformin  to 500mg  once daily. -Continue Ozempic  1mg  weekly subq. -Check A1c in one month; goal <7.5 - Small frequent meals, increase daily protein.         Other   Diarrhea - Primary   Daily, afternoon/hs episodes for the past two weeks, sometimes with urgency leading to incontinence. No associated fever, abdominal pain, or blood in stool. Stool formed in morning.  History of diverticulosis noted on a recent PET scan. Does not appear infectious nature. Possible medication side effect from Metformin  and Ozempic . -Reduce Metformin  500mg  to once daily. -Check CMP to assess electrolyte balance. -Advise on hydration and electrolyte replenishment with Pedialyte. -Advise on dietary changes (BRAT diet, increased fiber). -Consider over-the-counter Pepcid or Prilosec for belching -GI referral for further evaluation given hx of diverticulosis, new onset diarrhea, and no  hx of colon CA screening.      Relevant Orders   Comprehensive metabolic panel   Ambulatory referral to Gastroenterology   Elevated BP without diagnosis of hypertension   BP elevated during office visit, 152/85 No hx of HTN, does not monitor at home - Recommend purchasing at home cuff, upper arm automatic monitor x3 weekly - Given age - Goal SBP<140; DBP<80 - low sodium diet and exercise as tolerated - increase daily walking - f/u 4-6 weeks for BP check        No orders of the defined types were placed in this encounter.  Return in about 4 weeks (around 10/25/2023) for BP and A1C.  I, Curtis DELENA Boom,  FNP, have reviewed all documentation for this visit. The documentation on 09/27/23 for the exam, diagnosis, procedures, and orders are all accurate and complete.   Curtis DELENA Boom, FNP

## 2023-09-28 LAB — COMPREHENSIVE METABOLIC PANEL
ALT: 19 [IU]/L (ref 0–32)
AST: 25 [IU]/L (ref 0–40)
Albumin: 4.4 g/dL (ref 3.7–4.7)
Alkaline Phosphatase: 72 [IU]/L (ref 44–121)
BUN/Creatinine Ratio: 11 — ABNORMAL LOW (ref 12–28)
BUN: 8 mg/dL (ref 8–27)
Bilirubin Total: 0.6 mg/dL (ref 0.0–1.2)
CO2: 24 mmol/L (ref 20–29)
Calcium: 10.1 mg/dL (ref 8.7–10.3)
Chloride: 102 mmol/L (ref 96–106)
Creatinine, Ser: 0.75 mg/dL (ref 0.57–1.00)
Globulin, Total: 2.3 g/dL (ref 1.5–4.5)
Glucose: 125 mg/dL — ABNORMAL HIGH (ref 70–99)
Potassium: 4.3 mmol/L (ref 3.5–5.2)
Sodium: 142 mmol/L (ref 134–144)
Total Protein: 6.7 g/dL (ref 6.0–8.5)
eGFR: 77 mL/min/{1.73_m2} (ref >59)

## 2023-10-15 ENCOUNTER — Telehealth: Payer: Self-pay | Admitting: Family Medicine

## 2023-10-15 DIAGNOSIS — E1129 Type 2 diabetes mellitus with other diabetic kidney complication: Secondary | ICD-10-CM

## 2023-10-15 MED ORDER — FREESTYLE TEST VI STRP: ORAL_STRIP | 11 refills | Status: AC

## 2023-10-15 NOTE — Telephone Encounter (Signed)
[facility] pharmacy is requesting refill FREESTYLE TEST STRIPS test strip  Please advise

## 2023-10-18 ENCOUNTER — Telehealth: Payer: Self-pay | Admitting: Family Medicine

## 2023-10-18 DIAGNOSIS — R809 Proteinuria, unspecified: Secondary | ICD-10-CM

## 2023-10-18 NOTE — Telephone Encounter (Signed)
[facility] pharmacy is requesting refill Lancets (FREESTYLE) lancets  Please advise

## 2023-10-19 MED ORDER — FREESTYLE LANCETS MISC: 11 refills | Status: AC

## 2023-11-03 ENCOUNTER — Ambulatory Visit: Payer: Medicare Other | Admitting: Family Medicine

## 2023-11-11 ENCOUNTER — Encounter: Payer: Self-pay | Admitting: Family Medicine

## 2023-11-11 ENCOUNTER — Ambulatory Visit: Payer: Medicare Other | Admitting: Family Medicine

## 2023-11-11 VITALS — BP 133/86 | HR 96 | Temp 98.3°F | Resp 16 | Ht 68.0 in | Wt 168.0 lb

## 2023-11-11 DIAGNOSIS — R030 Elevated blood-pressure reading, without diagnosis of hypertension: Secondary | ICD-10-CM | POA: Diagnosis not present

## 2023-11-11 DIAGNOSIS — G4700 Insomnia, unspecified: Secondary | ICD-10-CM

## 2023-11-11 DIAGNOSIS — R197 Diarrhea, unspecified: Secondary | ICD-10-CM

## 2023-11-11 DIAGNOSIS — R809 Proteinuria, unspecified: Secondary | ICD-10-CM

## 2023-11-11 DIAGNOSIS — Z7984 Long term (current) use of oral hypoglycemic drugs: Secondary | ICD-10-CM

## 2023-11-11 DIAGNOSIS — E1129 Type 2 diabetes mellitus with other diabetic kidney complication: Secondary | ICD-10-CM

## 2023-11-11 LAB — POCT GLYCOSYLATED HEMOGLOBIN (HGB A1C): Hemoglobin A1C: 7.8 % — AB (ref 4.0–5.6)

## 2023-11-11 NOTE — Assessment & Plan Note (Signed)
 Intermittent, given hx of metformin use Improved during visit today Continue to make efforts to increase daily fiber Adequate hydration States concerns for intermittent urge incontinence - recommended pelvic floor exercise to pt and scheduling toilet times

## 2023-11-11 NOTE — Assessment & Plan Note (Signed)
 Stable today = 133/86 Given age<140/80, nearly at goal Continue to make efforts for increase weekly walking  Decrease added sodium in diet Water as drink of choice Discussed s/s of cardiac or stroke event Denies headaches or vision changes Continue to recommend at home monitoring once per week

## 2023-11-11 NOTE — Assessment & Plan Note (Signed)
 Chronic use of Zolpidem (Ambien) for sleep, with reported good effect and no adverse reactions.  - risks and benefits discussed of continued use, including potential memory loss and increased fall risk.  -Patient understands and wishes to continue. -Pt agreeable to trialing half tablet (2.5mg ) every other night -otherwise continue Zolpidem 5 mg as needed for sleep.

## 2023-11-11 NOTE — Progress Notes (Signed)
 Established Patient Office Visit  Introduced to nurse practitioner role and practice setting.  All questions answered.  Discussed provider/patient relationship and expectations.   Subjective   Patient ID: Raven Lewis, female    DOB: 1935/01/11  Age: 89 y.o. MRN: [id number]  Chief Complaint  Patient presents with   Medical Management of Chronic Issues    HTN and T2DM    Raven Lewis is an 89 year old female with type 2 diabetes who presents for a follow-up visit.  She has type 2 diabetes with a POC today of HbA1c of 7.8. She has not been consistently monitoring her blood glucose levels and has been less attentive to her diet, particularly in the last few days. She takes metformin 500 mg twice daily and administers Ozempic injections weekly on "Sundays. Her blood glucose readings have been higher than usual, typically around 160-170 mg/dL.  She experiences occasional diarrhea, which she attributes to metformin use. She recalls a recent episode last week, which she believes could have been prevented with more prompt response to the urge to defecate. This issue is not as severe as it was six weeks ago.  She has a history of insomnia and takes Ambien 5 mg at night to aid sleep. She has difficulty falling asleep due to racing thoughts but finds that Ambien helps her achieve a good night's rest. She typically sleeps from 10-11 PM until 6-7 AM, although she sometimes wakes up in the early morning hours feeling hungry, at which point she eats a small snack before returning to sleep.  Her physical activity is limited, although she maintains her household chores and wants to walk more now that the weather is improving. She feels guilty about not exercising more regularly.         12" /31/2024    9:26 AM 02/11/2023    1:05 PM 01/04/2023    4:14 PM  Depression screen PHQ 2/9  Decreased Interest 0 0 0  Down, Depressed, Hopeless 0 0 0  PHQ - 2 Score 0 0 0  Altered sleeping 0 0 3  Tired,  decreased energy 0 0 0  Change in appetite 0 0 0  Feeling bad or failure about yourself  0 0 0  Trouble concentrating 0 0 0  Moving slowly or fidgety/restless 0 0 0  Suicidal thoughts 0 0 0  PHQ-9 Score 0 0 3  Difficult doing work/chores Not difficult at all Not difficult at all Not difficult at all        No data to display           Review of Systems  All other systems reviewed and are negative.   Negative unless indicated in HPI   Objective:     BP 133/86 (BP Location: Left Arm, Patient Position: Sitting, Cuff Size: Normal)   Pulse 96   Temp 98.3 F (36.8 C) (Oral)   Resp 16   Ht 5\' 8"  (1.727 m)   Wt 168 lb (76.2 kg)   SpO2 98%   BMI 25.54 kg/m     Physical Exam Constitutional:      General: She is awake. She is not in acute distress.    Appearance: Normal appearance. She is well-groomed. She is not ill-appearing, toxic-appearing or diaphoretic.  HENT:     Head: Normocephalic.     Nose: Nose normal.     Mouth/Throat:     Mouth: Mucous membranes are moist.     Pharynx: Oropharynx is clear.  Eyes:  General:        Right eye: No discharge.        Left eye: No discharge.     Extraocular Movements: Extraocular movements intact.     Pupils: Pupils are equal, round, and reactive to light.  Neck:     Vascular: No carotid bruit.  Cardiovascular:     Rate and Rhythm: Normal rate and regular rhythm.     Pulses: Normal pulses.     Heart sounds: Normal heart sounds. No murmur heard.    No friction rub. No gallop.  Pulmonary:     Effort: No respiratory distress.     Breath sounds: No stridor. No wheezing, rhonchi or rales.  Chest:     Chest wall: No tenderness.  Musculoskeletal:     Right lower leg: No edema.     Left lower leg: No edema.  Lymphadenopathy:     Cervical: No cervical adenopathy.  Skin:    General: Skin is warm and dry.     Capillary Refill: Capillary refill takes less than 2 seconds.  Neurological:     General: No focal deficit  present.     Mental Status: She is alert and oriented to person, place, and time. Mental status is at baseline.  Psychiatric:        Attention and Perception: Perception normal.        Mood and Affect: Mood and affect normal.        Speech: Speech is tangential.        Behavior: Behavior is hyperactive. Behavior is cooperative.        Thought Content: Thought content normal.        Cognition and Memory: Cognition normal.        Judgment: Judgment normal.     Comments: Inattentive at times during conversation    Results for orders placed or performed in visit on 11/11/23  POCT glycosylated hemoglobin (Hb A1C)  Result Value Ref Range   Hemoglobin A1C 7.8 (A) 4.0 - 5.6 %       The ASCVD Risk score (Arnett DK, et al., 2019) failed to calculate for the following reasons:   The 2019 ASCVD risk score is only valid for ages 35 to 48    Assessment & Plan:  Type 2 diabetes mellitus with diabetic microalbuminuria, without long-term current use of insulin (HCC) Assessment & Plan: POC Hemoglobin A1c is 7.8% 11/11/23. Goal <8% given age -Metformin to 500mg  BID. -Continue Ozempic 1mg  weekly subq. -Continue daily fasting checks of BG and as needed, goal <130 fasting -Continue to make conscious decisions for well balanced diet  -smaller portions with increase protein, fruits, veggies, water as drink of choice -decrease starches, processed foods, and saturated fats -Increase weekly exercise - daily walks encourage with nicer weather Plan to recheck A1C in 4 months at next visit    Orders: -     POCT glycosylated hemoglobin (Hb A1C)  Elevated BP without diagnosis of hypertension Assessment & Plan: Stable today = 133/86 Given age<140/80, nearly at goal Continue to make efforts for increase weekly walking  Decrease added sodium in diet Water as drink of choice Discussed s/s of cardiac or stroke event Denies headaches or vision changes Continue to recommend at home monitoring once per  week   Insomnia, unspecified type Assessment & Plan: Chronic use of Zolpidem (Ambien) for sleep, with reported good effect and no adverse reactions.  - risks and benefits discussed of continued use, including potential memory loss and increased  fall risk.  -Patient understands and wishes to continue. -Pt agreeable to trialing half tablet (2.5mg ) every other night -otherwise continue Zolpidem 5 mg as needed for sleep.     Diarrhea, unspecified type Assessment & Plan: Intermittent, given hx of metformin use Improved during visit today Continue to make efforts to increase daily fiber Adequate hydration States concerns for intermittent urge incontinence - recommended pelvic floor exercise to pt and scheduling toilet times     Return in about 4 months (around 03/10/2024) for DMII - has appt with Dr B on 03/02/24.   I, Sallee Provencal, FNP, have reviewed all documentation for this visit. The documentation on 11/11/23 for the exam, diagnosis, procedures, and orders are all accurate and complete.   Sallee Provencal, FNP

## 2023-11-11 NOTE — Assessment & Plan Note (Addendum)
 POC Hemoglobin A1c is 7.8% 11/11/23. Goal <8% given age -Metformin to 500mg  BID. -Continue Ozempic 1mg  weekly subq. -Continue daily fasting checks of BG and as needed, goal <130 fasting -Continue to make conscious decisions for well balanced diet  -smaller portions with increase protein, fruits, veggies, water as drink of choice -decrease starches, processed foods, and saturated fats -Increase weekly exercise - daily walks encourage with nicer weather Plan to recheck A1C in 4 months at next visit

## 2024-02-17 ENCOUNTER — Ambulatory Visit: Payer: Self-pay

## 2024-02-17 NOTE — Telephone Encounter (Signed)
 FYI Only or Action Required?: FYI only for provider  Patient was last seen in primary care on 11/11/2023 by Tasia Farr, FNP. Called Nurse Triage reporting Cough and Nasal Congestion. Symptoms began About 3 days. Interventions attempted: Rest, hydration, or home remedies. Symptoms are: gradually worsening.  Triage Disposition: See Physician Within 24 Hours  Patient/caregiver understands and will follow disposition?: Yes---Patient going to Urgent Care due to no openings in PCP Office        Chest congestion x a few days Denies difficulty breathing Patient did fall Saturday over a curb but states she did not have any injuries,   Copied from CRM [id number]. Topic: Clinical - Red Word Triage >> Feb 17, 2024 12:37 PM Raven Lewis wrote: Red Word that prompted transfer to Nurse Triage: pt is reporting cough/congestion that wont get better and diarrhea after every meal. States she thought she was getting better but it seems to be getting worse. I advised pt that [facility] does not have any openings until 6/12 and she stated she can't go another week like this. Reason for Disposition  [1] Continuous (nonstop) coughing interferes with work or school AND [2] no improvement using cough treatment per Care Advice  Answer Assessment - Initial Assessment Questions 1. ONSET: "When did the cough begin?"      A few days ago 2. SEVERITY: "How bad is the cough today?"      Hacking cough per patient 3. SPUTUM: "Describe the color of your sputum" (none, dry cough; clear, white, yellow, green)     yellow 4. HEMOPTYSIS: "Are you coughing up any blood?" If so ask: "How much?" (flecks, streaks, tablespoons, etc.)     No 5. DIFFICULTY BREATHING: "Are you having difficulty breathing?" If Yes, ask: "How bad is it?" (e.g., mild, moderate, severe)    - MILD: No SOB at rest, mild SOB with walking, speaks normally in sentences, can lie down, no retractions, pulse < 100.    - MODERATE: SOB at rest, SOB with minimal exertion  and prefers to sit, cannot lie down flat, speaks in phrases, mild retractions, audible wheezing, pulse 100-120.    - SEVERE: Very SOB at rest, speaks in single words, struggling to breathe, sitting hunched forward, retractions, pulse > 120      Denies 6. FEVER: "Do you have a fever?" If Yes, ask: "What is your temperature, how was it measured, and when did it start?"     No 7. CARDIAC HISTORY: "Do you have any history of heart disease?" (e.g., heart attack, congestive heart failure)      No 8. LUNG HISTORY: "Do you have any history of lung disease?"  (e.g., pulmonary embolus, asthma, emphysema)     No 9. PE RISK FACTORS: "Do you have a history of blood clots?" (or: recent major surgery, recent prolonged travel, bedridden)     No 10. OTHER SYMPTOMS: "Do you have any other symptoms?" (e.g., runny nose, wheezing, chest pain)       Cough with yellow mucous, chest congestion      Patient states that she is going to wait until her daughter gets off work and she will talk to her daughter and go to an Urgent Care later today.  Protocols used: Cough - Acute Productive-A-AH

## 2024-03-02 ENCOUNTER — Ambulatory Visit: Payer: Self-pay | Admitting: Family Medicine

## 2024-03-06 ENCOUNTER — Ambulatory Visit (INDEPENDENT_AMBULATORY_CARE_PROVIDER_SITE_OTHER): Admitting: Family Medicine

## 2024-03-06 ENCOUNTER — Encounter: Payer: Self-pay | Admitting: Family Medicine

## 2024-03-06 VITALS — BP 137/86 | HR 80 | Ht 68.0 in | Wt 168.0 lb

## 2024-03-06 DIAGNOSIS — C434 Malignant melanoma of scalp and neck: Secondary | ICD-10-CM

## 2024-03-06 DIAGNOSIS — Z7985 Long-term (current) use of injectable non-insulin antidiabetic drugs: Secondary | ICD-10-CM

## 2024-03-06 DIAGNOSIS — R030 Elevated blood-pressure reading, without diagnosis of hypertension: Secondary | ICD-10-CM | POA: Diagnosis not present

## 2024-03-06 DIAGNOSIS — E1129 Type 2 diabetes mellitus with other diabetic kidney complication: Secondary | ICD-10-CM

## 2024-03-06 DIAGNOSIS — G4700 Insomnia, unspecified: Secondary | ICD-10-CM | POA: Diagnosis not present

## 2024-03-06 DIAGNOSIS — D696 Thrombocytopenia, unspecified: Secondary | ICD-10-CM

## 2024-03-06 DIAGNOSIS — Z711 Person with feared health complaint in whom no diagnosis is made: Secondary | ICD-10-CM | POA: Diagnosis not present

## 2024-03-06 DIAGNOSIS — R809 Proteinuria, unspecified: Secondary | ICD-10-CM

## 2024-03-06 MED ORDER — METFORMIN HCL ER 500 MG PO TB24: ORAL_TABLET | ORAL | 1 refills | Status: DC

## 2024-03-06 MED ORDER — ZOLPIDEM TARTRATE 5 MG PO TABS: 5.0000 mg | ORAL_TABLET | Freq: Every evening | ORAL | 5 refills | Status: DC | PRN

## 2024-03-06 NOTE — Assessment & Plan Note (Signed)
 Followed by oncology at [facility] s/p Mohs surgery on scalp with radiation/chemotherapy and doing well. -continue to follow up with [facility] at regularly scheduled appointment.

## 2024-03-06 NOTE — Assessment & Plan Note (Signed)
 Continue to monitor UACR

## 2024-03-06 NOTE — Assessment & Plan Note (Signed)
 Stable today at 137/86  Continue to make efforts for increase weekly walking

## 2024-03-06 NOTE — Assessment & Plan Note (Signed)
 Patient had another normal MMSE (29/30) but did not remember that she had done one 6 months prior referring to it as a fun new tool. She has noticed her memory has been challenging her and would like to do something about this. We discussed that memory issues due to aging or dementia are not reversible but there are things that can sometimes be done to slow the progression. We discussed a neurology referral and she was in agreement. -Refer to neurology -Follow up with us  in 6 months to reassess.

## 2024-03-06 NOTE — Assessment & Plan Note (Signed)
 A1c increased to 7.5% after discontinuing Ozempic . Previously managed with Ozempic  and metformin . Prefers primary care management over multiple specialist referrals. Discussed importance of maintaining A1c below 7.5%. No significant side effects from metformin .   - Unclear if patient is actually taking Ozempic    - Continue metformin  twice daily   - Follow up in three months to reassess A1c

## 2024-03-06 NOTE — Assessment & Plan Note (Signed)
 Chronic use of Zolpidem  (Ambien ) for sleep, with reported good effect and no adverse reactions. Patient understands risks of medicine and wishes to continue. -Continue Zolpidem  -Refill sent today

## 2024-03-06 NOTE — Progress Notes (Signed)
 Established Patient Office Visit  Subjective   Patient ID: Raven Lewis, female    DOB: Oct 16, 1934  Age: 89 y.o. MRN: [id number]  Chief Complaint  Patient presents with   Diabetes    No concerned    Raven Lewis is an 89 y/o female with history of diabetes, insomnia, and history of melanoma who is coming in today to follow up on her diabetes. Unfortunately her sister passed away this past week at the age of 23. Raven Lewis seems at peace with this and states her sister had a wonderful life. She says her mood has been good in spite of this recent event. She has not had any issues with her medications but would like refills sent of her Metformin  and Ambien . No changes to bladder habits but has noticed she is defecating more frequently, around 30 minutes after meals. She does not have any diarrhea, pain with defecation, or blood in urine or stool. She has had no fever or night sweats.  She also has had some memory concerns which have slightly worsened since her last visit 6 months ago. She states she has a bad memory at baseline but is having some difficulty remembering things. She is functional and lives independently; she writes many things down which helps her keep on track. She had a prior negative MMSE 6 months ago. She is interested in doing something bout her memory. She  Past Medical History:  Diagnosis Date   Allergic rhinitis    Cataract    Diabetes mellitus without complication (HCC)    Insomnia    Melanoma (HCC) 10/16/2020   Breslow's 1.22mm. Level III-IV. Tx at [facility]   Melanoma [facility]) 10/08/2021   Left upper arm. Superficial spreading. Breslow's 0.29mm. Clark's level II   MRSA (methicillin resistant Staphylococcus aureus) 2000   right elbow   Thrombocytopenia (HCC)    Thrombocytopenia, idiopathic (HCC) 12/20/2018   Past Surgical History:  Procedure Laterality Date   CATARACT EXTRACTION Left    ELBOW SURGERY     MRSA in right elbow   TUBAL LIGATION     WRIST  SURGERY     left wrist       Objective:     BP 137/86   Pulse 80   Ht 5' 8 (1.727 m)   Wt 168 lb (76.2 kg)   SpO2 99%   BMI 25.54 kg/m  BP Readings from Last 3 Encounters:  03/06/24 137/86  11/11/23 133/86  09/27/23 (!) 152/85   Wt Readings from Last 3 Encounters:  03/06/24 168 lb (76.2 kg)  11/11/23 168 lb (76.2 kg)  09/27/23 164 lb 14.4 oz (74.8 kg)   Physical Exam Constitutional:      General: She is not in acute distress.    Appearance: Normal appearance. She is normal weight.  HENT:     Head: Normocephalic and atraumatic.     Right Ear: External ear normal.     Left Ear: External ear normal.     Nose: Nose normal.     Mouth/Throat:     Mouth: Mucous membranes are moist.     Pharynx: Oropharynx is clear. No oropharyngeal exudate.   Eyes:     General: No scleral icterus.    Extraocular Movements: Extraocular movements intact.     Conjunctiva/sclera: Conjunctivae normal.     Pupils: Pupils are equal, round, and reactive to light.    Cardiovascular:     Rate and Rhythm: Normal rate and regular rhythm.  Pulses: Normal pulses.     Heart sounds: Normal heart sounds. No murmur heard.    No friction rub. No gallop.  Pulmonary:     Effort: Pulmonary effort is normal. No respiratory distress.     Breath sounds: Normal breath sounds. No stridor. No wheezing or rales.  Abdominal:     General: Abdomen is flat. There is no distension.   Musculoskeletal:        General: Normal range of motion.     Cervical back: Normal range of motion.   Skin:    General: Skin is warm and dry.     Coloration: Skin is not jaundiced.   Neurological:     General: No focal deficit present.     Mental Status: She is alert and oriented to person, place, and time.     Cranial Nerves: No cranial nerve deficit.     Motor: No weakness.   Psychiatric:        Mood and Affect: Mood normal.     Comments: Patient is quite repetitive with her history repeating the same element of  stories in the span of only a few minutes.     No results found for any visits on 03/06/24.  Last CBC Lab Results  Component Value Date   WBC 6.6 07/16/2020   HGB 13.7 07/16/2020   HCT 40.6 07/16/2020   MCV 99 (H) 07/16/2020   MCH 33.5 (H) 07/16/2020   RDW 12.8 07/16/2020   PLT 153 07/16/2020   Last metabolic panel Lab Results  Component Value Date   GLUCOSE 125 (H) 09/27/2023   NA 142 09/27/2023   K 4.3 09/27/2023   CL 102 09/27/2023   CO2 24 09/27/2023   BUN 8 09/27/2023   CREATININE 0.75 09/27/2023   EGFR 77 09/27/2023   CALCIUM 10.1 09/27/2023   PROT 6.7 09/27/2023   ALBUMIN 4.4 09/27/2023   LABGLOB 2.3 09/27/2023   AGRATIO 1.8 01/28/2021   BILITOT 0.6 09/27/2023   ALKPHOS 72 09/27/2023   AST 25 09/27/2023   ALT 19 09/27/2023   Last lipids Lab Results  Component Value Date   CHOL 115 02/13/2022   HDL 65 02/13/2022   LDLCALC 28 02/13/2022   TRIG 124 02/13/2022   CHOLHDL 1.8 02/13/2022   Last hemoglobin A1c Lab Results  Component Value Date   HGBA1C 7.8 (A) 11/11/2023   Last thyroid functions Lab Results  Component Value Date   TSH 1.240 07/16/2020      The ASCVD Risk score (Arnett DK, et al., 2019) failed to calculate for the following reasons:   The 2019 ASCVD risk score is only valid for ages 87 to 26    Assessment & Plan:   Problem List Items Addressed This Visit       Endocrine   T2DM (type 2 diabetes mellitus) (HCC) - Primary   A1c increased to 7.5% after discontinuing Ozempic . Previously managed with Ozempic  and metformin . Prefers primary care management over multiple specialist referrals. Discussed importance of maintaining A1c below 7.5%. No significant side effects from metformin .   - Unclear if patient is actually taking Ozempic    - Continue metformin  twice daily   - Follow up in three months to reassess A1c        Relevant Medications   metFORMIN  (GLUCOPHAGE -XR) 500 MG 24 hr tablet   Other Relevant Orders   HgB A1c   Basic  Metabolic Panel (BMET)   Microalbuminuria due to type 2 diabetes mellitus (HCC)   Continue  to monitor UACR       Relevant Medications   metFORMIN  (GLUCOPHAGE -XR) 500 MG 24 hr tablet     Other   Insomnia   Chronic use of Zolpidem  (Ambien ) for sleep, with reported good effect and no adverse reactions. Patient understands risks of medicine and wishes to continue. -Continue Zolpidem  -Refill sent today        Relevant Medications   zolpidem  (AMBIEN ) 5 MG tablet   Other Relevant Orders   TSH   Melanoma (HCC)   Followed by oncology at [facility] s/p Mohs surgery on scalp with radiation/chemotherapy and doing well. -continue to follow up with [facility] at regularly scheduled appointment.      Elevated BP without diagnosis of hypertension   Stable today at 137/86  Continue to make efforts for increase weekly walking         Relevant Orders   Basic Metabolic Panel (BMET)   Concern about memory   Patient had another normal MMSE (29/30) but did not remember that she had done one 6 months prior referring to it as a fun new tool. She has noticed her memory has been challenging her and would like to do something about this. We discussed that memory issues due to aging or dementia are not reversible but there are things that can sometimes be done to slow the progression. We discussed a neurology referral and she was in agreement. -Refer to neurology -Follow up with us  in 6 months to reassess.      Relevant Orders   HgB A1c   Basic Metabolic Panel (BMET)   CBC w/Diff/Platelet   TSH   Ambulatory referral to Neurology   Other Visit Diagnoses       Thrombocytopenia (HCC)       Relevant Orders   CBC w/Diff/Platelet       Return in about 6 months (around 09/05/2024) for chronic disease f/u.    Raven Lewis, Medical Student   Patient seen along with MS3 student, Raven Lewis. I personally evaluated this patient along with the student, and verified all aspects of the history, physical  exam, and medical decision making as documented by the student. I agree with the student's documentation and have made all necessary edits.  Raven Lewis, Raven HERO, MD, MPH [facility]

## 2024-03-07 ENCOUNTER — Ambulatory Visit: Payer: Self-pay | Admitting: Family Medicine

## 2024-03-07 LAB — CBC WITH DIFFERENTIAL/PLATELET
Basophils Absolute: 0.1 10*3/uL (ref 0.0–0.2)
Basos: 1 %
EOS (ABSOLUTE): 0.1 10*3/uL (ref 0.0–0.4)
Eos: 2 %
Hematocrit: 44.5 % (ref 34.0–46.6)
Hemoglobin: 15 g/dL (ref 11.1–15.9)
Immature Grans (Abs): 0 10*3/uL (ref 0.0–0.1)
Immature Granulocytes: 0 %
Lymphocytes Absolute: 1.1 10*3/uL (ref 0.7–3.1)
Lymphs: 16 %
MCH: 34.4 pg — ABNORMAL HIGH (ref 26.6–33.0)
MCHC: 33.7 g/dL (ref 31.5–35.7)
MCV: 102 fL — ABNORMAL HIGH (ref 79–97)
Monocytes Absolute: 0.5 10*3/uL (ref 0.1–0.9)
Monocytes: 6 %
Neutrophils Absolute: 5.4 10*3/uL (ref 1.4–7.0)
Neutrophils: 75 %
Platelets: 156 10*3/uL (ref 150–450)
RBC: 4.36 x10E6/uL (ref 3.77–5.28)
RDW: 13.1 % (ref 11.7–15.4)
WBC: 7.1 10*3/uL (ref 3.4–10.8)

## 2024-03-07 LAB — BASIC METABOLIC PANEL WITH GFR
BUN/Creatinine Ratio: 16 (ref 12–28)
BUN: 12 mg/dL (ref 8–27)
CO2: 24 mmol/L (ref 20–29)
Calcium: 9.6 mg/dL (ref 8.7–10.3)
Chloride: 101 mmol/L (ref 96–106)
Creatinine, Ser: 0.74 mg/dL (ref 0.57–1.00)
Glucose: 127 mg/dL — ABNORMAL HIGH (ref 70–99)
Potassium: 4.5 mmol/L (ref 3.5–5.2)
Sodium: 140 mmol/L (ref 134–144)
eGFR: 79 mL/min/{1.73_m2} (ref >59)

## 2024-03-07 LAB — TSH: TSH: 0.948 u[IU]/mL (ref 0.450–4.500)

## 2024-03-07 LAB — HEMOGLOBIN A1C
Est. average glucose Bld gHb Est-mCnc: 151 mg/dL
Hgb A1c MFr Bld: 6.9 % — ABNORMAL HIGH (ref 4.8–5.6)

## 2024-03-10 ENCOUNTER — Other Ambulatory Visit: Payer: Self-pay | Admitting: Family Medicine

## 2024-03-10 NOTE — Telephone Encounter (Unsigned)
 Copied from CRM [id number]. Topic: Clinical - Medication Refill >> Mar 10, 2024 11:14 AM Emylou G wrote: Medication: Semaglutide , 1 MG/DOSE, (OZEMPIC , 1 MG/DOSE,) 4 MG/3ML SOPN  Has the patient contacted their pharmacy? No (Agent: If no, request that the patient contact the pharmacy for the refill. If patient does not wish to contact the pharmacy document the reason why and proceed with request.) (Agent: If yes, when and what did the pharmacy advise?)  This is the patient's preferred pharmacy:  [id number] - [place], [place] - [address] AT [facility] [address] [place] [address] Phone: [phone] Fax: [phone]    Is this the correct pharmacy for this prescription? Yes If no, delete pharmacy and type the correct one.   Has the prescription been filled recently? No  Is the patient out of the medication? Yes  Has the patient been seen for an appointment in the last year OR does the patient have an upcoming appointment? Yes  Can we respond through MyChart? No  Agent: Please be advised that Rx refills may take up to 3 business days. We ask that you follow-up with your pharmacy.

## 2024-03-13 MED ORDER — OZEMPIC (1 MG/DOSE) 4 MG/3ML ~~LOC~~ SOPN: PEN_INJECTOR | SUBCUTANEOUS | 0 refills | Status: DC

## 2024-03-13 NOTE — Telephone Encounter (Signed)
 Requested Prescriptions  Pending Prescriptions Disp Refills   Semaglutide , 1 MG/DOSE, (OZEMPIC , 1 MG/DOSE,) 4 MG/3ML SOPN 9 mL 0    Sig: INJECT 1MG  SUBCUTANEOUSLY  ONCE A WEEK     Endocrinology:  Diabetes - GLP-1 Receptor Agonists - semaglutide  Failed - 03/13/2024 12:41 PM      Failed - HBA1C in normal range and within 180 days    Hemoglobin A1C  Date Value Ref Range Status  07/29/2023 7.3  Final   Hgb A1c MFr Bld  Date Value Ref Range Status  03/06/2024 6.9 (H) 4.8 - 5.6 % Final    Comment:             Prediabetes: 5.7 - 6.4          Diabetes: >6.4          Glycemic control for adults with diabetes: <7.0          Passed - Cr in normal range and within 360 days    Creatinine, Ser  Date Value Ref Range Status  03/06/2024 0.74 0.57 - 1.00 mg/dL Final         Passed - Valid encounter within last 6 months    Recent Outpatient Visits           1 week ago Type 2 diabetes mellitus with diabetic microalbuminuria, without long-term current use of insulin [facility])   [facility] [place], Jon HERO, MD   4 months ago Type 2 diabetes mellitus with diabetic microalbuminuria, without long-term current use of insulin [facility])   [place], Curtis LABOR, [place]

## 2024-05-29 ENCOUNTER — Telehealth: Payer: Self-pay | Admitting: Family Medicine

## 2024-05-29 MED ORDER — OZEMPIC (1 MG/DOSE) 4 MG/3ML ~~LOC~~ SOPN: PEN_INJECTOR | SUBCUTANEOUS | 1 refills | Status: DC

## 2024-05-29 NOTE — Telephone Encounter (Signed)
 Rx sent to pharmacy

## 2024-05-29 NOTE — Telephone Encounter (Signed)
 [facility] faxed refill request for the following medications  Semaglutide , 1 MG/DOSE, (OZEMPIC , 1 MG/DOSE,) 4 MG/3ML SOPN   Please advise.

## 2024-08-31 ENCOUNTER — Encounter: Payer: Self-pay | Admitting: Family Medicine

## 2024-08-31 ENCOUNTER — Ambulatory Visit: Admitting: Family Medicine

## 2024-08-31 VITALS — BP 126/80 | HR 77 | Ht 68.0 in | Wt 161.4 lb

## 2024-08-31 DIAGNOSIS — R809 Proteinuria, unspecified: Secondary | ICD-10-CM | POA: Diagnosis not present

## 2024-08-31 DIAGNOSIS — Z7984 Long term (current) use of oral hypoglycemic drugs: Secondary | ICD-10-CM

## 2024-08-31 DIAGNOSIS — Z7985 Long-term (current) use of injectable non-insulin antidiabetic drugs: Secondary | ICD-10-CM

## 2024-08-31 DIAGNOSIS — E1129 Type 2 diabetes mellitus with other diabetic kidney complication: Secondary | ICD-10-CM

## 2024-08-31 DIAGNOSIS — G4700 Insomnia, unspecified: Secondary | ICD-10-CM

## 2024-08-31 DIAGNOSIS — R030 Elevated blood-pressure reading, without diagnosis of hypertension: Secondary | ICD-10-CM | POA: Diagnosis not present

## 2024-08-31 MED ORDER — METFORMIN HCL ER 500 MG PO TB24: ORAL_TABLET | ORAL | 1 refills | Status: AC

## 2024-08-31 MED ORDER — OZEMPIC (1 MG/DOSE) 4 MG/3ML ~~LOC~~ SOPN: PEN_INJECTOR | SUBCUTANEOUS | 1 refills | Status: AC

## 2024-08-31 MED ORDER — ZOLPIDEM TARTRATE 5 MG PO TABS: 5.0000 mg | ORAL_TABLET | Freq: Every evening | ORAL | 5 refills | Status: AC | PRN

## 2024-08-31 NOTE — Progress Notes (Signed)
 Established patient visit   Patient: Raven Lewis   DOB: [birth year]   89 y.o. Female  MRN: [id number] Visit Date: 08/31/2024  Today's healthcare provider: Jon Eva, MD   Chief Complaint  Patient presents with   Medical Management of Chronic Issues   Diabetes    Last seen for diabetes 6 months ago.  Management since then includes continue current regimen. She reports excellent compliance with treatment. She is not having side effects.  Symptoms: visual disturbances Home blood sugar records: fasting range: 160's Episodes of hypoglycemia? No   Current insulin regiment: none Most Recent Eye Exam: scheduled next week at [facility] Current exercise: none Current diet habits: well balanced   Subjective    Diabetes   HPI     Diabetes    Additional comments: Last seen for diabetes 6 months ago.  Management since then includes continue current regimen. She reports excellent compliance with treatment. She is not having side effects.  Symptoms: visual disturbances Home blood sugar records: fasting range: 160's Episodes of hypoglycemia? No   Current insulin regiment: none Most Recent Eye Exam: scheduled next week at [facility] Current exercise: none Current diet habits: well balanced      Last edited by Lilian Fitzpatrick, CMA on 08/31/2024  9:32 AM.       Discussed the use of AI scribe software for clinical note transcription with the patient, who gave verbal consent to proceed.  History of Present Illness   Raven Lewis is an 89 year old female with diabetes who presents for a routine follow-up visit.  She is pleased with her last HbA1c of 6.9% and hopes for similar control today. She is not strict with her diet and notes new cravings for sweets. She takes metformin  and Ozempic . Labs including HbA1c, cholesterol, kidney and liver function, and urine protein will be checked today.  She uses Ambien  5 mg nightly for sleep and is worried it may be  discontinued. She reports chronic difficulty falling asleep without it and better sleep maintenance when she takes it.  She has not been walking for exercise but stays active by cleaning and doing her own household tasks to maintain independence. She lives in a condo, has a supportive daughter, and sometimes has difficulty with directions in her familiar area.  She keeps night lights on throughout her home for safety.       Medications: Show/hide medication list[1]  Review of Systems     Objective    BP 126/80 (BP Location: Left Arm, Patient Position: Sitting, Cuff Size: Normal)   Pulse 77   Ht 5' 8 (1.727 m)   Wt 161 lb 6.4 oz (73.2 kg)   SpO2 100%   BMI 24.54 kg/m    Physical Exam Vitals reviewed.  Constitutional:      General: She is not in acute distress.    Appearance: Normal appearance. She is well-developed. She is not diaphoretic.  HENT:     Head: Normocephalic and atraumatic.  Eyes:     General: No scleral icterus.    Conjunctiva/sclera: Conjunctivae normal.  Neck:     Thyroid: No thyromegaly.  Cardiovascular:     Rate and Rhythm: Normal rate and regular rhythm.     Heart sounds: Normal heart sounds. No murmur heard. Pulmonary:     Effort: Pulmonary effort is normal. No respiratory distress.     Breath sounds: Normal breath sounds. No wheezing, rhonchi or rales.  Musculoskeletal:     Cervical back: Neck supple.  Right lower leg: No edema.     Left lower leg: No edema.  Lymphadenopathy:     Cervical: No cervical adenopathy.  Skin:    General: Skin is warm and dry.     Findings: No rash.  Neurological:     Mental Status: She is alert and oriented to person, place, and time. Mental status is at baseline.  Psychiatric:        Mood and Affect: Mood normal.        Behavior: Behavior normal.     Diabetic Foot Exam - Simple   Simple Foot Form Diabetic Foot exam was performed with the following findings: Yes 08/31/2024 10:03 AM  Visual Inspection No  deformities, no ulcerations, no other skin breakdown bilaterally: Yes Sensation Testing Intact to touch and monofilament testing bilaterally: Yes Pulse Check Posterior Tibialis and Dorsalis pulse intact bilaterally: Yes Comments     No results found for any visits on 08/31/24.  Assessment & Plan     Problem List Items Addressed This Visit       Endocrine   T2DM (type 2 diabetes mellitus) (HCC) - Primary   Type 2 diabetes mellitus with previous A1c of 6.9, indicating good control. Diabetic kidney complication requires monitoring of kidney function. - Ordered A1c, cholesterol, kidney and liver function tests, and urine test for protein levels - Sent refills for metformin  and Ozempic  to pharmacy      Relevant Medications   Semaglutide , 1 MG/DOSE, (OZEMPIC , 1 MG/DOSE,) 4 MG/3ML SOPN   metFORMIN  (GLUCOPHAGE -XR) 500 MG 24 hr tablet   Other Relevant Orders   Hemoglobin A1c   Comprehensive metabolic panel with GFR   Microalbumin / creatinine urine ratio   Lipid panel   Microalbuminuria due to type 2 diabetes mellitus (HCC)   Relevant Medications   Semaglutide , 1 MG/DOSE, (OZEMPIC , 1 MG/DOSE,) 4 MG/3ML SOPN   metFORMIN  (GLUCOPHAGE -XR) 500 MG 24 hr tablet   Other Relevant Orders   Microalbumin / creatinine urine ratio     Other   Insomnia   Chronic insomnia managed with Ambien  5 mg. Discussed risks of long-term Ambien  use, including memory issues, increased risk of falls, and confusion. She values quality of life and finds Ambien  beneficial for sleep and overall well-being. Alternative treatments are less effective. - Continue Ambien  5 mg - Sent refill for Ambien  to pharmacy      Relevant Medications   zolpidem  (AMBIEN ) 5 MG tablet   Elevated BP without diagnosis of hypertension   Blood pressure was elevated upon arrival but improved after relaxation. - Rechecked blood pressure later in the visit           General health maintenance Routine health maintenance discussed,  including upcoming eye doctor appointment and foot exam performed today. - Performed foot exam - Scheduled follow-up in six months        Return in about 6 months (around 03/01/2025) for chronic disease f/u.       Jon Eva, MD  [facility] [phone] (phone) [phone] (fax)   Medical Group     [1]  Outpatient Medications Prior to Visit  Medication Sig   Blood Glucose Monitoring Suppl (FREESTYLE LITE) w/Device KIT USE AS DIRECTED   FREESTYLE TEST STRIPS test strip TEST THREE TIMES DAILY AS DIRECTEDTEST THREE TIMES DAILY AS DIRECTED   Lancets (FREESTYLE) lancets TEST THREE TIMES DAILY   Multiple Vitamin (MULTIVITAMIN) tablet Take 1 tablet by mouth daily.   [DISCONTINUED] metFORMIN  (GLUCOPHAGE -XR) 500 MG 24 hr  tablet TAKE 1 TABLET BY MOUTH TWICE DAILY   [DISCONTINUED] Semaglutide , 1 MG/DOSE, (OZEMPIC , 1 MG/DOSE,) 4 MG/3ML SOPN INJECT 1MG  SUBCUTANEOUSLY  ONCE A WEEK   [DISCONTINUED] zolpidem  (AMBIEN ) 5 MG tablet Take 1 tablet (5 mg total) by mouth at bedtime as needed for sleep.   No facility-administered medications prior to visit.

## 2024-08-31 NOTE — Assessment & Plan Note (Signed)
 Blood pressure was elevated upon arrival but improved after relaxation. - Rechecked blood pressure later in the visit

## 2024-08-31 NOTE — Assessment & Plan Note (Signed)
 Chronic insomnia managed with Ambien  5 mg. Discussed risks of long-term Ambien  use, including memory issues, increased risk of falls, and confusion. She values quality of life and finds Ambien  beneficial for sleep and overall well-being. Alternative treatments are less effective. - Continue Ambien  5 mg - Sent refill for Ambien  to pharmacy

## 2024-08-31 NOTE — Assessment & Plan Note (Signed)
 Type 2 diabetes mellitus with previous A1c of 6.9, indicating good control. Diabetic kidney complication requires monitoring of kidney function. - Ordered A1c, cholesterol, kidney and liver function tests, and urine test for protein levels - Sent refills for metformin  and Ozempic  to pharmacy

## 2024-09-02 LAB — LIPID PANEL
Chol/HDL Ratio: 2.8 ratio (ref 0.0–4.4)
Cholesterol, Total: 173 mg/dL (ref 100–199)
HDL: 62 mg/dL
LDL Chol Calc (NIH): 73 mg/dL (ref 0–99)
Triglycerides: 237 mg/dL — ABNORMAL HIGH (ref 0–149)
VLDL Cholesterol Cal: 38 mg/dL (ref 5–40)

## 2024-09-02 LAB — COMPREHENSIVE METABOLIC PANEL WITH GFR
ALT: 18 IU/L (ref 0–32)
AST: 22 IU/L (ref 0–40)
Albumin: 4.3 g/dL (ref 3.7–4.7)
Alkaline Phosphatase: 61 IU/L (ref 48–129)
BUN/Creatinine Ratio: 15 (ref 12–28)
BUN: 9 mg/dL (ref 8–27)
Bilirubin Total: 0.7 mg/dL (ref 0.0–1.2)
CO2: 24 mmol/L (ref 20–29)
Calcium: 9.7 mg/dL (ref 8.7–10.3)
Chloride: 101 mmol/L (ref 96–106)
Creatinine, Ser: 0.61 mg/dL (ref 0.57–1.00)
Globulin, Total: 2 g/dL (ref 1.5–4.5)
Glucose: 191 mg/dL — ABNORMAL HIGH (ref 70–99)
Potassium: 4.1 mmol/L (ref 3.5–5.2)
Sodium: 141 mmol/L (ref 134–144)
Total Protein: 6.3 g/dL (ref 6.0–8.5)
eGFR: 86 mL/min/1.73

## 2024-09-02 LAB — HEMOGLOBIN A1C
Est. average glucose Bld gHb Est-mCnc: 163 mg/dL
Hgb A1c MFr Bld: 7.3 % — AB (ref 4.8–5.6)

## 2024-09-02 LAB — MICROALBUMIN / CREATININE URINE RATIO
Creatinine, Urine: 84.7 mg/dL
Microalb/Creat Ratio: 27 mg/g{creat} (ref 0–29)
Microalbumin, Urine: 22.6 ug/mL

## 2024-09-03 ENCOUNTER — Emergency Department

## 2024-09-03 ENCOUNTER — Inpatient Hospital Stay
Admission: EM | Admit: 2024-09-03 | Discharge: 2024-09-05 | DRG: 373 | Disposition: A | Discharge status: Home / Self Care | Source: Ambulatory Visit | Attending: Surgery | Admitting: Surgery

## 2024-09-03 ENCOUNTER — Other Ambulatory Visit: Payer: Self-pay

## 2024-09-03 DIAGNOSIS — Z87891 Personal history of nicotine dependence: Secondary | ICD-10-CM

## 2024-09-03 DIAGNOSIS — E119 Type 2 diabetes mellitus without complications: Secondary | ICD-10-CM | POA: Diagnosis present

## 2024-09-03 DIAGNOSIS — Z85118 Personal history of other malignant neoplasm of bronchus and lung: Secondary | ICD-10-CM

## 2024-09-03 DIAGNOSIS — Z7984 Long term (current) use of oral hypoglycemic drugs: Secondary | ICD-10-CM

## 2024-09-03 DIAGNOSIS — K3580 Unspecified acute appendicitis: Secondary | ICD-10-CM | POA: Diagnosis present

## 2024-09-03 DIAGNOSIS — K353 Acute appendicitis with localized peritonitis, without perforation or gangrene: Principal | ICD-10-CM | POA: Diagnosis present

## 2024-09-03 DIAGNOSIS — Z85820 Personal history of malignant melanoma of skin: Secondary | ICD-10-CM

## 2024-09-03 DIAGNOSIS — Z9221 Personal history of antineoplastic chemotherapy: Secondary | ICD-10-CM

## 2024-09-03 DIAGNOSIS — K3533 Acute appendicitis with perforation and localized peritonitis, with abscess: Secondary | ICD-10-CM | POA: Diagnosis present

## 2024-09-03 DIAGNOSIS — Z8249 Family history of ischemic heart disease and other diseases of the circulatory system: Secondary | ICD-10-CM

## 2024-09-03 DIAGNOSIS — Z7985 Long-term (current) use of injectable non-insulin antidiabetic drugs: Secondary | ICD-10-CM

## 2024-09-03 HISTORY — DX: Solitary pulmonary nodule: R91.1

## 2024-09-03 LAB — COMPREHENSIVE METABOLIC PANEL WITH GFR
ALT: 16 U/L (ref 0–44)
AST: 20 U/L (ref 15–41)
Albumin: 4.5 g/dL (ref 3.5–5.0)
Alkaline Phosphatase: 79 U/L (ref 38–126)
Anion gap: 11 (ref 5–15)
BUN: 9 mg/dL (ref 8–23)
CO2: 28 mmol/L (ref 22–32)
Calcium: 9.7 mg/dL (ref 8.9–10.3)
Chloride: 100 mmol/L (ref 98–111)
Creatinine, Ser: 0.59 mg/dL (ref 0.44–1.00)
GFR, Estimated: >60 mL/min
Glucose, Bld: 162 mg/dL — ABNORMAL HIGH (ref 70–99)
Potassium: 3.9 mmol/L (ref 3.5–5.1)
Sodium: 139 mmol/L (ref 135–145)
Total Bilirubin: 1 mg/dL (ref 0.0–1.2)
Total Protein: 7.4 g/dL (ref 6.5–8.1)

## 2024-09-03 LAB — GLUCOSE, CAPILLARY: Glucose-Capillary: 173 mg/dL — ABNORMAL HIGH (ref 70–99)

## 2024-09-03 LAB — CBC
HCT: 44.2 % (ref 36.0–46.0)
Hemoglobin: 15.5 g/dL — ABNORMAL HIGH (ref 12.0–15.0)
MCH: 33.8 pg (ref 26.0–34.0)
MCHC: 35.1 g/dL (ref 30.0–36.0)
MCV: 96.5 fL (ref 80.0–100.0)
Platelets: 146 K/uL — ABNORMAL LOW (ref 150–400)
RBC: 4.58 MIL/uL (ref 3.87–5.11)
RDW: 12.4 % (ref 11.5–15.5)
WBC: 11 K/uL — ABNORMAL HIGH (ref 4.0–10.5)
nRBC: 0 % (ref 0.0–0.2)

## 2024-09-03 LAB — LIPASE, BLOOD: Lipase: 32 U/L (ref 11–51)

## 2024-09-03 MED ORDER — TRAMADOL HCL 50 MG PO TABS
50.0000 mg | ORAL_TABLET | Freq: Four times a day (QID) | ORAL | Status: DC | PRN
  Administered 2024-09-04: 50 mg via ORAL
  Filled 2024-09-03: qty 1

## 2024-09-03 MED ORDER — ONDANSETRON HCL 4 MG/2ML IJ SOLN
4.0000 mg | Freq: Four times a day (QID) | INTRAMUSCULAR | Status: DC | PRN
  Administered 2024-09-04: 4 mg via INTRAVENOUS
  Filled 2024-09-03: qty 2

## 2024-09-03 MED ORDER — MORPHINE SULFATE (PF) 2 MG/ML IV SOLN: 2.0000 mg | INTRAVENOUS | Status: DC | PRN

## 2024-09-03 MED ORDER — INSULIN ASPART 100 UNIT/ML IJ SOLN
0.0000 [IU] | Freq: Three times a day (TID) | INTRAMUSCULAR | Status: DC
  Administered 2024-09-04: 5 [IU] via SUBCUTANEOUS
  Administered 2024-09-04: 8 [IU] via SUBCUTANEOUS
  Filled 2024-09-03: qty 8
  Filled 2024-09-03: qty 5
  Filled 2024-09-03: qty 2

## 2024-09-03 MED ORDER — ZOLPIDEM TARTRATE 5 MG PO TABS
5.0000 mg | ORAL_TABLET | Freq: Every evening | ORAL | Status: DC | PRN
  Administered 2024-09-04 (×2): 5 mg via ORAL
  Filled 2024-09-03 (×2): qty 1

## 2024-09-03 MED ORDER — SODIUM CHLORIDE 0.9 % IV SOLN
2.0000 g | INTRAVENOUS | Status: DC
  Administered 2024-09-03 – 2024-09-04 (×2): 2 g via INTRAVENOUS
  Filled 2024-09-03 (×3): qty 20

## 2024-09-03 MED ORDER — IOHEXOL 300 MG/ML  SOLN
100.0000 mL | Freq: Once | INTRAMUSCULAR | Status: AC | PRN
  Administered 2024-09-03: 100 mL via INTRAVENOUS

## 2024-09-03 MED ORDER — SODIUM CHLORIDE 0.9 % IV SOLN: INTRAVENOUS | Status: DC

## 2024-09-03 MED ORDER — DOCUSATE SODIUM 100 MG PO CAPS: 100.0000 mg | ORAL_CAPSULE | Freq: Two times a day (BID) | ORAL | Status: DC | PRN

## 2024-09-03 MED ORDER — METRONIDAZOLE 500 MG/100ML IV SOLN
500.0000 mg | Freq: Two times a day (BID) | INTRAVENOUS | Status: DC
  Administered 2024-09-03 – 2024-09-04 (×3): 500 mg via INTRAVENOUS
  Filled 2024-09-03 (×5): qty 100

## 2024-09-03 MED ORDER — ONDANSETRON 4 MG PO TBDP: 4.0000 mg | ORAL_TABLET | Freq: Four times a day (QID) | ORAL | Status: DC | PRN

## 2024-09-03 MED ORDER — ACETAMINOPHEN 325 MG PO TABS
650.0000 mg | ORAL_TABLET | Freq: Three times a day (TID) | ORAL | Status: DC | PRN
  Administered 2024-09-04 (×2): 650 mg via ORAL
  Filled 2024-09-03 (×2): qty 2

## 2024-09-03 MED ORDER — HYDROCODONE-ACETAMINOPHEN 5-325 MG PO TABS
1.0000 | ORAL_TABLET | Freq: Three times a day (TID) | ORAL | Status: DC | PRN
  Administered 2024-09-03 – 2024-09-05 (×2): 1 via ORAL
  Filled 2024-09-03 (×2): qty 1

## 2024-09-03 NOTE — H&P (Signed)
 Subjective:   CC: Acute appendicitis  HPI:  Raven Lewis is a 89 y.o. female who is consulted by [facility] for evaluation of  above cc.  Symptoms were first noted a few days ago. Pain is sharp, right lower quadrant associated with nothing specific, exacerbated by nothing specific     Past Medical History:  has a past medical history of Allergic rhinitis, Cataract, Diabetes mellitus without complication (HCC), Insomnia, Lung nodule, Melanoma (HCC) (10/16/2020), Melanoma (HCC) (10/08/2021), MRSA (methicillin resistant Staphylococcus aureus) (2000), Thrombocytopenia, and Thrombocytopenia, idiopathic (HCC) (12/20/2018).  Past Surgical History:  has a past surgical history that includes Elbow surgery; Wrist surgery; Cataract extraction (Left); and Tubal ligation.  Family History: family history includes Breast cancer (age of onset: 62) in her mother; Heart disease in her sister and sister; Lung cancer in her brother.  Social History:  reports that she quit smoking about 23 years ago. Her smoking use included cigarettes. She started smoking about 63 years ago. She has a 60 pack-year smoking history. She has never used smokeless tobacco. She reports that she does not currently use alcohol after a past usage of about 1.0 standard drink of alcohol per week. She reports that she does not use drugs.  Current Medications:  Prior to Admission medications  Medication Sig Start Date End Date Taking? Authorizing Provider  metFORMIN  (GLUCOPHAGE -XR) 500 MG 24 hr tablet TAKE 1 TABLET BY MOUTH TWICE DAILY 08/31/24  Yes Bacigalupo, Angela M, MD  Multiple Vitamin (MULTIVITAMIN) tablet Take 1 tablet by mouth daily.   Yes [provider]  Semaglutide , 1 MG/DOSE, (OZEMPIC , 1 MG/DOSE,) 4 MG/3ML SOPN INJECT 1MG  SUBCUTANEOUSLY  ONCE A WEEK 08/31/24  Yes Bacigalupo, Jon HERO, MD  zolpidem  (AMBIEN ) 5 MG tablet Take 1 tablet (5 mg total) by mouth at bedtime as needed for sleep. 08/31/24  Yes Bacigalupo, Jon HERO,  MD  Blood Glucose Monitoring Suppl (FREESTYLE LITE) w/Device KIT USE AS DIRECTED 09/16/21   Myrla Jon HERO, MD  FREESTYLE TEST STRIPS test strip TEST THREE TIMES DAILY AS DIRECTEDTEST THREE TIMES DAILY AS DIRECTED 10/15/23   Bacigalupo, Jon HERO, MD  Lancets (FREESTYLE) lancets TEST THREE TIMES DAILY 10/19/23   Bacigalupo, Jon HERO, MD    Allergies:  Allergies as of 09/03/2024   (No Known Allergies)    ROS:  Pertinent negative and positives noted in HPI   Objective:     BP (!) 191/102 (BP Location: Left Arm)   Pulse 82   Temp 98.1 F (36.7 C) (Oral)   Resp 20   Ht 5' 8 (1.727 m)   Wt 70.8 kg   SpO2 98%   BMI 23.72 kg/m    Constitutional :  alert, cooperative, appears stated age, and no distress  Respiratory:  Clear to auscultation bilaterally  Cardiovascular:  Regular rate and rhythm  Gastrointestinal: Soft, no guarding, tenderness to palpation right lower quadrant.   Skin: Cool and moist   Psychiatric: Normal affect, non-agitated, not confused       LABS:     Latest Ref Rng & Units 09/03/2024    4:22 PM 08/31/2024   10:14 AM 03/06/2024   10:30 AM  CMP  Glucose 70 - 99 mg/dL 837  808  872   BUN 8 - 23 mg/dL 9  9  12    Creatinine 0.44 - 1.00 mg/dL 9.40  9.38  9.25   Sodium 135 - 145 mmol/L 139  141  140   Potassium 3.5 - 5.1 mmol/L 3.9  4.1  4.5  Chloride 98 - 111 mmol/L 100  101  101   CO2 22 - 32 mmol/L 28  24  24    Calcium 8.9 - 10.3 mg/dL 9.7  9.7  9.6   Total Protein 6.5 - 8.1 g/dL 7.4  6.3    Total Bilirubin 0.0 - 1.2 mg/dL 1.0  0.7    Alkaline Phos 38 - 126 U/L 79  61    AST 15 - 41 U/L 20  22    ALT 0 - 44 U/L 16  18        Latest Ref Rng & Units 09/03/2024    4:22 PM 03/06/2024   10:30 AM 07/16/2020   11:45 AM  CBC  WBC 4.0 - 10.5 K/uL 11.0  7.1  6.6   Hemoglobin 12.0 - 15.0 g/dL 84.4  84.9  86.2   Hematocrit 36.0 - 46.0 % 44.2  44.5  40.6   Platelets 150 - 400 K/uL 146  156  153      RADS: Narrative & Impression  EXAM: CT ABDOMEN AND  PELVIS WITH CONTRAST 09/03/2024 05:32:41 PM   TECHNIQUE: CT of the abdomen and pelvis was performed with the administration of 100 mL of iohexol  (OMNIPAQUE ) 300 MG/ML solution. Multiplanar reformatted images are provided for review. Automated exposure control, iterative reconstruction, and/or weight-based adjustment of the mA/kV was utilized to reduce the radiation dose to as low as reasonably achievable.   COMPARISON: None available.   CLINICAL HISTORY: RLQ abdominal pain.   FINDINGS:   LOWER CHEST: Elevation of the left hemidiaphragm with subsegmental left basilar atelectasis. Multivessel coronary atherosclerosis.   LIVER: Mild diffuse hepatic steatosis.   GALLBLADDER AND BILE DUCTS: Gallbladder is unremarkable. No biliary ductal dilatation.   SPLEEN: No acute abnormality.   PANCREAS: No acute abnormality.   ADRENAL GLANDS: No acute abnormality.   KIDNEYS, URETERS AND BLADDER: 1 cm left upper pole cyst. Per consensus, no follow-up is needed for simple Bosniak type 1 and 2 renal cysts, unless the patient has a malignancy history or risk factors. Normal variant circumaortic left renal vein. No stones in the kidneys or ureters. No hydronephrosis. No perinephric or periureteral stranding. The urinary bladder is distended without focal abnormality.   GI AND BOWEL: Stomach demonstrates no acute abnormality. Extensive total colonic diverticulosis, most severe within the sigmoid colon. No changes of acute diverticulitis. The appendix is dilated and fluid filled measuring 1 cm. Small appendicolith proximally. Adjacent inflammatory stranding noted without periappendiceal abscess or pneumoperitoneum. There is no bowel obstruction.   PERITONEUM AND RETROPERITONEUM: No ascites. No free air.   VASCULATURE: Aorta is normal in caliber. Diffuse aortoiliac atherosclerosis.   LYMPH NODES: No lymphadenopathy.   REPRODUCTIVE ORGANS: Age related atrophy of the uterus and  ovaries. 2.5 cm fundal fibroid.   BONES AND SOFT TISSUES: Multilevel degenerative disc disease of the thoracolumbar spine. Mild to moderate bilateral hip osteoarthritis. Diffuse osteopenia. No acute osseous abnormality. No focal soft tissue abnormality.   IMPRESSION: 1. Acute appendicitis. No  periappendiceal abscess or pneumoperitoneum.   Electronically signed by: Rogelia Myers MD 09/03/2024 05:40 PM EST RP Workstation: [id number]     Assessment:      Acute appendicitis  Plan:      Discussed the risk of surgery including post-op infxn, seroma, hematoma, abscess formation, chronic pain, poor-delayed wound healing, possible bowel resection, possible ostomy, possible conversion to open procedure, post-op SBO or ileus, and need for additional procedures to address said risks.  The risks of general anesthetic including MI,  CVA, sudden death or even reaction to anesthetic medications also discussed. Alternatives include continued observation, or antibiotic treatment.  Benefits include possible symptom relief,   Typical post operative recovery of 3-5 days rest, also discussed.  The patient understands the risks, any and all questions were answered to the patient's satisfaction.  2 OR for robotic assisted laparoscopic appendectomy.  IV antibiotics in the meantime.  Sliding scale insulin  for diabetes  Full code per patient discussion  labs/images/medications/previous chart entries reviewed personally and relevant changes/updates noted above.

## 2024-09-03 NOTE — ED Notes (Signed)
 Pt alert, NAD, calm, interactive, ambulatory with steady gait, resps e/u, speaking in clear complete sentences, rates abd pain 5/10, denies fever, NVD. Daughter at [facility]. Last BM this am. Last ate 0700 this am.

## 2024-09-03 NOTE — ED Triage Notes (Signed)
 Pt to ED sent by [facility] for RLQ abdominal pain since Friday AM. Sent for appendicitis workup. When pain started on 12/19 it was also to RLQ (not periumbilical). Pt had rebound tenderness to palpation at UC per daughter.

## 2024-09-03 NOTE — ED Notes (Signed)
 Pt ambulatory to restroom

## 2024-09-03 NOTE — Progress Notes (Addendum)
 Subjective Patient ID: Raven Lewis is a 89 y.o. female.    Raven Lewis is a 89 y.o. female who presents to the clinic for evaluation of achy right sided lower abdominal pain 3 days ago. Reports to pain level is a 4/10 (0-10).  Reports to palpation of the area of stomach causes increased pain. States that pain is exacerbated with certain movements from sitting and getting getting up. States that she does not feel any lumps or masses. Denies any diarrhea, nausea, vomiting. States that she has had decreased appetite today. Denies any urinary symptoms. Denies any fever, chills, aches. Denies any recent straining activity. Denies any recent trauma to the stomach.   Daughter present in room.    History provided by:  Patient (daughter) Language interpreter used: No     Review of Systems  Constitutional:  Positive for appetite change. Negative for chills, fatigue and fever.  Gastrointestinal:  Positive for abdominal pain and nausea. Negative for diarrhea and vomiting.  Genitourinary:  Positive for flank pain. Negative for dysuria, frequency, urgency, vaginal bleeding and vaginal discharge.  Musculoskeletal:  Negative for back pain.  Skin:  Negative for rash.    Patient History  Allergies: No Known Allergies  Past Medical History:  Diagnosis Date   Diabetes    History reviewed. No pertinent surgical history. Social History   Socioeconomic History   Marital status: Widowed    Spouse name: Not on file   Number of children: Not on file   Years of education: Not on file   Highest education level: Not on file  Occupational History   Not on file  Tobacco Use   Smoking status: Never   Smokeless tobacco: Never  Vaping Use   Vaping status: Never Used  Substance and Sexual Activity   Alcohol use: Not on file   Drug use: Never   Sexual activity: Not on file  Other Topics Concern   Not on file  Social History Narrative   Not on file   History reviewed. No  pertinent family history. Current Outpatient Medications on File Prior to Visit  Medication Sig Dispense Refill   albuterol 108 (90 Base) MCG/ACT inhaler Inhale 2 puffs every 6 (six) hours if needed for wheezing. 18 g 0   FREESTYLE TEST STRIPS test strip TEST THREE TIMES DAILY AS DIRECTEDTEST THREE TIMES DAILY AS DIRECTED     metFORMIN  XR (Glucophage -XR) 500 MG 24 hr tablet Take 1 tablet by mouth in the morning and 1 tablet before bedtime.     Ozempic , 1 MG/DOSE, 4 MG/3ML solution pen-injector INJECT 1 MG UNDER THE SKIN ONCE WEEKLY     zolpidem  (Ambien ) 5 MG tablet Take 1 tablet by mouth 1 (one) time each day if needed.     No current facility-administered medications on file prior to visit.    Objective  Vitals:   09/03/24 1457  BP: (!) 194/96  Pulse: 77  Resp: 18  Temp: 36.8 C (98.2 F)  SpO2: 99%  Weight: 71.1 kg  Height: 5' 8  PainSc:   4        OBGYN/Pregnancy Status: Postmenopausal      No results found.  Physical Exam Vitals and nursing note reviewed.  Constitutional:      General: She is not in acute distress.    Appearance: Normal appearance. She is not ill-appearing, toxic-appearing or diaphoretic.  HENT:     Head: Normocephalic and atraumatic.  Eyes:     Conjunctiva/sclera: Conjunctivae normal.  Pulmonary:  Effort: Pulmonary effort is normal. No respiratory distress.  Abdominal:     General: Abdomen is flat. There is no distension.     Palpations: Abdomen is soft.     Tenderness: There is abdominal tenderness in the right upper quadrant and right lower quadrant. There is guarding. There is no rebound. Positive signs include McBurney's sign.   Musculoskeletal:     Cervical back: Normal range of motion and neck supple. No rigidity.  Skin:    General: Skin is dry.     Coloration: Skin is not jaundiced or pale.     Findings: No rash.  Neurological:     Mental Status: She is alert.  Psychiatric:        Mood and Affect: Mood normal.         Behavior: Behavior normal.     Results for orders placed or performed in visit on 09/03/24  POCT urinalysis dipstick manually resulted  Component Result   Color, UA Yellow   Clarity, UA Cloudy (A)   Glucose, UA 1+ (A)   Bilirubin, UA Undetected   Ketones, UA Negative   Spec Grav, UA 1.025   POCT Blood UA Negative   pH, UA 6.0   Protein, UA Negative   Urobilinogen, UA Negative   Leukocytes, UA Trace (A)   Nitrite, UA Negative       Procedures MDM:     1+ Acute illness or injury that poses a threat to life or bodily function     Explanation of Medical Decision Making and variances from expected care:    89 y/o with hx of DM presents with acute abdominal pain localized to the RUQ and RLQ, associated with decreased appetite. Exam notable for RUQ tenderness, positive McBurney's point tenderness, and guarding, raising concern for acute intra-abdominal pathology including but not limited to appendicitis or hepatobiliary disease. Given exam findings , patient requires urgent ED evaluation today for further workup, including abdominal imaging and laboratory studies. Patient advised to proceed to the Emergency Department immediately for higher level of care. Daughter driving.     Unique ordered tests: One     Review of any test results: One     Assessment requiring historian other than patient: No     Independent visualization of image, tracing, or test: No     Discussion of management with another provider: No     Risk:: High            Assessment/Plan Diagnoses and all orders for this visit:  Right upper quadrant abdominal pain -     POCT urinalysis dipstick manually resulted -     Referral to Emergency Medicine; Future  Right lower quadrant abdominal pain -     POCT urinalysis dipstick manually resulted -     Referral to Emergency Medicine; Future  Elevated blood pressure reading     Disposition Status: Emergency Department  Patient Instructions  Please be  advised that evaluating abdominal pain in an urgent care setting has certain limitations. While we strive to provide thorough and timely care, urgent care facilities may not have access to advanced imaging or specialized diagnostic tools that are sometimes necessary to fully assess abdominal conditions. Abdominal pain can be a complex and non-specific symptom with a wide range of potential causes--from minor to serious. We strongly recommend seeking further evaluation in an emergency department. Your health and safety are our top priorities.   Progress note signed by Alm Prophet, PA on 09/03/24 at  4:08 PM

## 2024-09-03 NOTE — ED Provider Notes (Signed)
 "  [facility] Provider Note    Event Date/Time   First MD Initiated Contact with Patient 09/03/24 1815     (approximate)   History   Chief Complaint: Abdominal Pain and appendicitis rule out from UC   HPI  Raven Lewis is a 89 y.o. female with a history of type 2 diabetes, indolent lung cancer who comes ED complaining of right lower quadrant abdominal pain for the past 2 days, gradual onset and worsening, worse with movement.  No fever, no chest pain shortness of breath.  Last oral intake was 7 AM this morning.  Not on blood thinners.        Past Medical History:  Diagnosis Date   Allergic rhinitis    Cataract    Diabetes mellitus without complication (HCC)    Insomnia    Lung nodule    Melanoma (HCC) 10/16/2020   Breslow's 1.35mm. Level III-IV. Tx at [facility]   Melanoma [facility]) 10/08/2021   Left upper arm. Superficial spreading. Breslow's 0.72mm. Clark's level II   MRSA (methicillin resistant Staphylococcus aureus) 2000   right elbow   Thrombocytopenia    Thrombocytopenia, idiopathic (HCC) 12/20/2018    Current Outpatient Rx   Order #: [id number] Class: Normal   Order #: [id number] Class: Historical Med   Order #: [id number] Class: Normal   Order #: [id number] Class: Normal   Order #: [id number] Class: Normal   Order #: [id number] Class: Normal   Order #: [id number] Class: Normal    Past Surgical History:  Procedure Laterality Date   CATARACT EXTRACTION Left    ELBOW SURGERY     MRSA in right elbow   TUBAL LIGATION     WRIST SURGERY     left wrist    Physical Exam   Triage Vital Signs: ED Triage Vitals  Encounter Vitals Group     BP 09/03/24 1615 (!) 183/103     Girls Systolic BP Percentile --      Girls Diastolic BP Percentile --      Boys Systolic BP Percentile --      Boys Diastolic BP Percentile --      Pulse Rate 09/03/24 1613 (P) 76     Resp 09/03/24 1613 (P) 18     Temp 09/03/24 1613 (!) (P) 97.4 F (36.3 C)     Temp  Source 09/03/24 1613 (P) Oral     SpO2 09/03/24 1613 (P) 97 %     Weight 09/03/24 1617 156 lb (70.8 kg)     Height 09/03/24 1617 5' 8 (1.727 m)     Head Circumference --      Peak Flow --      Pain Score 09/03/24 1616 4     Pain Loc --      Pain Education --      Exclude from Growth Chart --     Most recent vital signs: Vitals:   09/03/24 1615 09/03/24 1850  BP: (!) 183/103 (!) 191/102  Pulse: 78 82  Resp: 20 20  Temp: (!) 97.4 F (36.3 C) 98.1 F (36.7 C)  SpO2: 99% 98%    General: Awake, no distress.  CV:  Good peripheral perfusion.  Regular rate rhythm Resp:  Normal effort.  Clear lungs Abd:  No distention.  Soft with focal right lower quadrant tenderness Other:  Moist oral mucosa   ED Results / Procedures / Treatments   Labs (all labs ordered are listed, but only abnormal results are displayed) Labs Reviewed  COMPREHENSIVE METABOLIC PANEL  WITH GFR - Abnormal; Notable for the following components:      Result Value   Glucose, Bld 162 (*)    All other components within normal limits  CBC - Abnormal; Notable for the following components:   WBC 11.0 (*)    Hemoglobin 15.5 (*)    Platelets 146 (*)    All other components within normal limits  LIPASE, BLOOD  BASIC METABOLIC PANEL WITH GFR  CBC     EKG    RADIOLOGY CT abdomen pelvis interpreted by me, shows dilated appendix with surrounding fat stranding and appendicolith.  Radiology report reviewed, no signs of perforation or abscess   PROCEDURES:  Procedures   MEDICATIONS ORDERED IN ED: Medications  acetaminophen  (TYLENOL ) tablet 650 mg (has no administration in time range)  traMADol  (ULTRAM ) tablet 50 mg (has no administration in time range)  HYDROcodone -acetaminophen  (NORCO/VICODIN) 5-325 MG per tablet 1 tablet (has no administration in time range)  morphine  (PF) 2 MG/ML injection 2 mg (has no administration in time range)  docusate sodium  (COLACE) capsule 100 mg (has no administration in time  range)  ondansetron  (ZOFRAN -ODT) disintegrating tablet 4 mg (has no administration in time range)    Or  ondansetron  (ZOFRAN ) injection 4 mg (has no administration in time range)  cefTRIAXone  (ROCEPHIN ) 2 g in sodium chloride  0.9 % 100 mL IVPB (has no administration in time range)    And  metroNIDAZOLE  (FLAGYL ) IVPB 500 mg (has no administration in time range)  0.9 %  sodium chloride  infusion (has no administration in time range)  insulin  aspart (novoLOG ) injection 0-15 Units (has no administration in time range)  iohexol  (OMNIPAQUE ) 300 MG/ML solution 100 mL (100 mLs Intravenous Contrast Given 09/03/24 1732)     IMPRESSION / MDM / ASSESSMENT AND PLAN / ED COURSE  I reviewed the triage vital signs and the nursing notes.  DDx: Appendicitis, diverticulitis, UTI, kidney stone, AKI, electrolyte derangement  Patient's presentation is most consistent with acute presentation with potential threat to life or bodily function.  Patient presents with worsening right lower quadrant pain, unremarkable vital signs except for severe hypertension.  No endorgan dysfunction related to high blood pressure.  Has right lower quadrant tenderness, CT reveals uncomplicated appendicitis.  Discussed with surgery Dr. [name] who will admit for surgical management.       FINAL CLINICAL IMPRESSION(S) / ED DIAGNOSES   Final diagnoses:  Acute appendicitis with localized peritonitis, without perforation, abscess, or gangrene  Type 2 diabetes mellitus without complication, without long-term current use of insulin  (HCC)     Rx / DC Orders   ED Discharge Orders     None        Note:  This document was prepared using Dragon voice recognition software and may include unintentional dictation errors.   Viviann Pastor, MD 09/03/24 1935  "

## 2024-09-04 ENCOUNTER — Observation Stay: Admitting: Registered Nurse

## 2024-09-04 ENCOUNTER — Encounter
Admission: EM | Disposition: A | Discharge status: Home / Self Care | Payer: Self-pay | Source: Ambulatory Visit | Attending: Surgery

## 2024-09-04 ENCOUNTER — Encounter: Payer: Self-pay | Admitting: Surgery

## 2024-09-04 ENCOUNTER — Other Ambulatory Visit: Payer: Self-pay

## 2024-09-04 DIAGNOSIS — Z9221 Personal history of antineoplastic chemotherapy: Secondary | ICD-10-CM | POA: Diagnosis not present

## 2024-09-04 DIAGNOSIS — Z85118 Personal history of other malignant neoplasm of bronchus and lung: Secondary | ICD-10-CM | POA: Diagnosis not present

## 2024-09-04 DIAGNOSIS — K3533 Acute appendicitis with perforation and localized peritonitis, with abscess: Secondary | ICD-10-CM | POA: Diagnosis present

## 2024-09-04 DIAGNOSIS — Z87891 Personal history of nicotine dependence: Secondary | ICD-10-CM | POA: Diagnosis not present

## 2024-09-04 DIAGNOSIS — Z8249 Family history of ischemic heart disease and other diseases of the circulatory system: Secondary | ICD-10-CM | POA: Diagnosis not present

## 2024-09-04 DIAGNOSIS — Z85820 Personal history of malignant melanoma of skin: Secondary | ICD-10-CM | POA: Diagnosis not present

## 2024-09-04 DIAGNOSIS — R1031 Right lower quadrant pain: Secondary | ICD-10-CM | POA: Diagnosis present

## 2024-09-04 DIAGNOSIS — E119 Type 2 diabetes mellitus without complications: Secondary | ICD-10-CM | POA: Diagnosis present

## 2024-09-04 DIAGNOSIS — Z7985 Long-term (current) use of injectable non-insulin antidiabetic drugs: Secondary | ICD-10-CM | POA: Diagnosis not present

## 2024-09-04 DIAGNOSIS — K353 Acute appendicitis with localized peritonitis, without perforation or gangrene: Secondary | ICD-10-CM | POA: Diagnosis present

## 2024-09-04 DIAGNOSIS — Z7984 Long term (current) use of oral hypoglycemic drugs: Secondary | ICD-10-CM | POA: Diagnosis not present

## 2024-09-04 HISTORY — PX: XI ROBOTIC LAPAROSCOPIC ASSISTED APPENDECTOMY: SHX6877

## 2024-09-04 LAB — CBC
HCT: 38.7 % (ref 36.0–46.0)
Hemoglobin: 13.6 g/dL (ref 12.0–15.0)
MCH: 33.6 pg (ref 26.0–34.0)
MCHC: 35.1 g/dL (ref 30.0–36.0)
MCV: 95.6 fL (ref 80.0–100.0)
Platelets: 115 K/uL — ABNORMAL LOW (ref 150–400)
RBC: 4.05 MIL/uL (ref 3.87–5.11)
RDW: 12.4 % (ref 11.5–15.5)
WBC: 8.7 K/uL (ref 4.0–10.5)
nRBC: 0 % (ref 0.0–0.2)

## 2024-09-04 LAB — BASIC METABOLIC PANEL WITH GFR
Anion gap: 13 (ref 5–15)
BUN: 8 mg/dL (ref 8–23)
CO2: 25 mmol/L (ref 22–32)
Calcium: 8.7 mg/dL — ABNORMAL LOW (ref 8.9–10.3)
Chloride: 102 mmol/L (ref 98–111)
Creatinine, Ser: 0.53 mg/dL (ref 0.44–1.00)
GFR, Estimated: >60 mL/min
Glucose, Bld: 169 mg/dL — ABNORMAL HIGH (ref 70–99)
Potassium: 3.5 mmol/L (ref 3.5–5.1)
Sodium: 139 mmol/L (ref 135–145)

## 2024-09-04 LAB — GLUCOSE, CAPILLARY
Glucose-Capillary: 162 mg/dL — ABNORMAL HIGH (ref 70–99)
Glucose-Capillary: 190 mg/dL — ABNORMAL HIGH (ref 70–99)
Glucose-Capillary: 232 mg/dL — ABNORMAL HIGH (ref 70–99)
Glucose-Capillary: 262 mg/dL — ABNORMAL HIGH (ref 70–99)
Glucose-Capillary: 200 mg/dL — ABNORMAL HIGH (ref 70–99)

## 2024-09-04 SURGERY — APPENDECTOMY, ROBOT-ASSISTED, LAPAROSCOPIC
Anesthesia: General | Site: Abdomen

## 2024-09-04 MED ORDER — CHLORHEXIDINE GLUCONATE 0.12 % MT SOLN
15.0000 mL | Freq: Once | OROMUCOSAL | Status: AC
  Administered 2024-09-04: 15 mL via OROMUCOSAL

## 2024-09-04 MED ORDER — ENOXAPARIN SODIUM 40 MG/0.4ML IJ SOSY
40.0000 mg | PREFILLED_SYRINGE | INTRAMUSCULAR | Status: DC
  Filled 2024-09-04: qty 0.4

## 2024-09-04 MED ORDER — PROPOFOL 10 MG/ML IV BOLUS
INTRAVENOUS | Status: DC | PRN
  Administered 2024-09-04: 150 mg via INTRAVENOUS

## 2024-09-04 MED ORDER — OXYCODONE-ACETAMINOPHEN 5-325 MG PO TABS
1.0000 | ORAL_TABLET | Freq: Three times a day (TID) | ORAL | 0 refills | Status: AC | PRN
  Filled 2024-09-04: qty 6, 2d supply, fill #0

## 2024-09-04 MED ORDER — FENTANYL CITRATE (PF) 100 MCG/2ML IJ SOLN
INTRAMUSCULAR | Status: DC | PRN
  Administered 2024-09-04 (×2): 50 ug via INTRAVENOUS

## 2024-09-04 MED ORDER — ROCURONIUM BROMIDE 10 MG/ML (PF) SYRINGE
PREFILLED_SYRINGE | INTRAVENOUS | Status: AC
  Filled 2024-09-04: qty 10

## 2024-09-04 MED ORDER — MIDAZOLAM HCL (PF) 2 MG/2ML IJ SOLN
INTRAMUSCULAR | Status: DC | PRN
  Administered 2024-09-04: 2 mg via INTRAVENOUS

## 2024-09-04 MED ORDER — 0.9 % SODIUM CHLORIDE (POUR BTL) OPTIME
TOPICAL | Status: DC | PRN
  Administered 2024-09-04: 500 mL

## 2024-09-04 MED ORDER — BUPIVACAINE-EPINEPHRINE (PF) 0.5% -1:200000 IJ SOLN
INTRAMUSCULAR | Status: DC | PRN
  Administered 2024-09-04: 15 mL

## 2024-09-04 MED ORDER — PROPOFOL 10 MG/ML IV BOLUS
INTRAVENOUS | Status: AC
  Filled 2024-09-04: qty 20

## 2024-09-04 MED ORDER — SUGAMMADEX SODIUM 200 MG/2ML IV SOLN
INTRAVENOUS | Status: DC | PRN
  Administered 2024-09-04: 200 mg via INTRAVENOUS

## 2024-09-04 MED ORDER — ROCURONIUM BROMIDE 100 MG/10ML IV SOLN
INTRAVENOUS | Status: DC | PRN
  Administered 2024-09-04: 40 mg via INTRAVENOUS

## 2024-09-04 MED ORDER — ACETAMINOPHEN 10 MG/ML IV SOLN
INTRAVENOUS | Status: DC | PRN
  Administered 2024-09-04: 1000 mg via INTRAVENOUS

## 2024-09-04 MED ORDER — DOCUSATE SODIUM 100 MG PO CAPS
100.0000 mg | ORAL_CAPSULE | Freq: Two times a day (BID) | ORAL | 0 refills | Status: AC | PRN
  Filled 2024-09-04: qty 20, 10d supply, fill #0

## 2024-09-04 MED ORDER — FENTANYL CITRATE (PF) 100 MCG/2ML IJ SOLN
INTRAMUSCULAR | Status: AC
  Filled 2024-09-04: qty 2

## 2024-09-04 MED ORDER — ACETAMINOPHEN 10 MG/ML IV SOLN: 1000.0000 mg | Freq: Once | INTRAVENOUS | Status: DC | PRN

## 2024-09-04 MED ORDER — IBUPROFEN 400 MG PO TABS
400.0000 mg | ORAL_TABLET | Freq: Three times a day (TID) | ORAL | 0 refills | Status: AC | PRN
  Filled 2024-09-04: qty 30, 10d supply, fill #0

## 2024-09-04 MED ORDER — BUPIVACAINE-EPINEPHRINE (PF) 0.5% -1:200000 IJ SOLN
INTRAMUSCULAR | Status: AC
  Filled 2024-09-04: qty 30

## 2024-09-04 MED ORDER — LACTATED RINGERS IV SOLN: INTRAVENOUS | Status: DC | PRN

## 2024-09-04 MED ORDER — ACETAMINOPHEN 10 MG/ML IV SOLN
INTRAVENOUS | Status: AC
  Filled 2024-09-04: qty 100

## 2024-09-04 MED ORDER — LACTATED RINGERS IV SOLN: INTRAVENOUS | Status: DC

## 2024-09-04 MED ORDER — ONDANSETRON HCL 4 MG/2ML IJ SOLN
INTRAMUSCULAR | Status: DC | PRN
  Administered 2024-09-04: 4 mg via INTRAVENOUS

## 2024-09-04 MED ORDER — DEXAMETHASONE SOD PHOSPHATE PF 10 MG/ML IJ SOLN
INTRAMUSCULAR | Status: DC | PRN
  Administered 2024-09-04: 5 mg via INTRAVENOUS

## 2024-09-04 MED ORDER — ACETAMINOPHEN 325 MG PO TABS
650.0000 mg | ORAL_TABLET | Freq: Three times a day (TID) | ORAL | 0 refills | Status: AC | PRN
  Filled 2024-09-04: qty 40, 7d supply, fill #0

## 2024-09-04 MED ORDER — LIDOCAINE HCL (CARDIAC) PF 100 MG/5ML IV SOSY
PREFILLED_SYRINGE | INTRAVENOUS | Status: DC | PRN
  Administered 2024-09-04: 100 mg via INTRAVENOUS

## 2024-09-04 MED ORDER — MIDAZOLAM HCL 2 MG/2ML IJ SOLN
INTRAMUSCULAR | Status: AC
  Filled 2024-09-04: qty 2

## 2024-09-04 MED ADMIN — Ketorolac Tromethamine Inj 30 MG/ML: 15 mg | INTRAVENOUS | NDC 68462075625

## 2024-09-04 SURGICAL SUPPLY — 44 items
ANCHOR TIS RET SYS 235ML (MISCELLANEOUS) ×1 IMPLANT
BAG PRESSURE INF REUSE 1000 (BAG) IMPLANT
COVER TIP SHEARS 8 DVNC (MISCELLANEOUS) ×1 IMPLANT
COVER WAND RF STERILE (DRAPES) ×1 IMPLANT
DERMABOND ADVANCED .7 DNX12 (GAUZE/BANDAGES/DRESSINGS) ×1 IMPLANT
DRAIN CHANNEL JP 15F RND 3/16 (MISCELLANEOUS) IMPLANT
DRAPE ARM DVNC X/XI (DISPOSABLE) ×3 IMPLANT
DRAPE COLUMN DVNC XI (DISPOSABLE) ×1 IMPLANT
ELECTRODE REM PT RTRN 9FT ADLT (ELECTROSURGICAL) ×1 IMPLANT
EVACUATOR DRAINAGE 7X20 100CC (MISCELLANEOUS) IMPLANT
EVACUATOR SILICONE 100CC (DRAIN) IMPLANT
FORCEPS BPLR FENES DVNC XI (FORCEP) ×1 IMPLANT
GLOVE BIOGEL PI IND STRL 7.0 (GLOVE) ×2 IMPLANT
GLOVE SURG SYN 6.5 PF PI (GLOVE) ×4 IMPLANT
GOWN STRL REUS W/ TWL LRG LVL3 (GOWN DISPOSABLE) ×4 IMPLANT
GRASPER SUT TROCAR 14GX15 (MISCELLANEOUS) IMPLANT
IRRIGATOR SUCT 8 DISP DVNC XI (IRRIGATION / IRRIGATOR) IMPLANT
IV 0.9% NACL 1000 ML (IV SOLUTION) IMPLANT
KIT TURNOVER KIT A (KITS) ×1 IMPLANT
LABEL OR SOLS (LABEL) IMPLANT
MANIFOLD NEPTUNE II (INSTRUMENTS) ×1 IMPLANT
NDL HYPO 22X1.5 SAFETY MO (MISCELLANEOUS) ×1 IMPLANT
NDL INSUFFLATION 14GA 120MM (NEEDLE) ×1 IMPLANT
NEEDLE HYPO 22X1.5 SAFETY MO (MISCELLANEOUS) ×1 IMPLANT
NEEDLE INSUFFLATION 14GA 120MM (NEEDLE) ×1 IMPLANT
OBTURATOR OPTICALSTD 8 DVNC (TROCAR) ×1 IMPLANT
PACK LAP CHOLECYSTECTOMY (MISCELLANEOUS) ×1 IMPLANT
RELOAD STAPLE 45 2.5 WHT DVNC (STAPLE) IMPLANT
RELOAD STAPLE 45 3.5 BLU DVNC (STAPLE) IMPLANT
SCISSORS MNPLR CVD DVNC XI (INSTRUMENTS) ×1 IMPLANT
SEAL UNIV 5-12 XI (MISCELLANEOUS) ×3 IMPLANT
SEALER VESSEL EXT DVNC XI (MISCELLANEOUS) IMPLANT
SET TUBE SMOKE EVAC HIGH FLOW (TUBING) ×1 IMPLANT
SOLN STERILE WATER 500 ML (IV SOLUTION) ×1 IMPLANT
SOLUTION ELECTROSURG ANTI STCK (MISCELLANEOUS) ×1 IMPLANT
STAPLER 45 SUREFORM DVNC (STAPLE) IMPLANT
SUT MNCRL AB 4-0 PS2 18 (SUTURE) ×1 IMPLANT
SUT VIC AB 3-0 SH 27X BRD (SUTURE) IMPLANT
SUT VICRYL 0 UR6 27IN ABS (SUTURE) ×1 IMPLANT
SUTURE EHLN 3-0 FS-10 30 BLK (SUTURE) IMPLANT
SYR 30ML LL (SYRINGE) ×1 IMPLANT
SYSTEM WECK SHIELD CLOSURE (TROCAR) IMPLANT
TRAP FLUID SMOKE EVACUATOR (MISCELLANEOUS) ×1 IMPLANT
TRAY FOLEY MTR SLVR 16FR STAT (SET/KITS/TRAYS/PACK) ×1 IMPLANT

## 2024-09-04 NOTE — Anesthesia Preprocedure Evaluation (Addendum)
"                                    Anesthesia Evaluation  Patient identified by MRN, date of birth, ID band Patient awake    Reviewed: Allergy & Precautions, NPO status , Patient's Chart, lab work & pertinent test results  History of Anesthesia Complications Negative for: history of anesthetic complications  Airway Mallampati: III   Neck ROM: Full    Dental no notable dental hx.    Pulmonary former smoker (quit 2002)   Pulmonary exam normal breath sounds clear to auscultation       Cardiovascular Exercise Tolerance: Good negative cardio ROS Normal cardiovascular exam Rhythm:Regular Rate:Normal     Neuro/Psych negative neurological ROS     GI/Hepatic negative GI ROS,,,  Endo/Other  diabetes, Type 2    Renal/GU      Musculoskeletal   Abdominal   Peds  Hematology  (+) Blood dyscrasia (hx idiopathic thrombocytopenia) Melanoma scalp   Anesthesia Other Findings Last dose of Ozempic  08/27/24.   Cardiology note 12/23/23:  89 y.o. female with  1. Type 2 diabetes mellitus without complication, without long-term current use of insulin  (CMS/HHS-HCC)  2. Palpitations  3. Tachycardia  4. Controlled type 2 diabetes mellitus without complication, unspecified whether long term insulin  use (CMS/HHS-HCC)  5. Chronic sinusitis, unspecified location  6. Mixed hyperlipidemia  7. SOB (shortness of breath)  8. Murmur, cardiac  9. Abnormal EKG  10. Chest pain at rest  11. Thrombocytopenia, idiopathic (CMS/HHS-HCC)   Plan  Palpitations intermittent recurrent recommend conservative therapy consider beta-blocker or calcium blocker Tachycardia intermittent recurrent consider low-dose beta-blockade therapy or calcium blocker routine aerobic exercise Diabetes type 2 currently on metformin  and Ozempic  Hyperlipidemia recommend statin therapy if unable to reach goal with diet and exercise Shortness of breath dyspnea on exertion continue conservative therapy History of  melanoma receiving immunotherapy follow-up with oncology Generalized fatigue chronic no worse continue current therapy Recommend routine modest aerobic exercise Have the patient follow-up in 1 year  Return in about 1 year (around 12/22/2024).    Reproductive/Obstetrics                              Anesthesia Physical Anesthesia Plan  ASA: 2  Anesthesia Plan: General   Post-op Pain Management:    Induction: Intravenous  PONV Risk Score and Plan: 3 and Ondansetron , Dexamethasone  and Treatment may vary due to age or medical condition  Airway Management Planned: Oral ETT  Additional Equipment:   Intra-op Plan:   Post-operative Plan: Extubation in OR  Informed Consent: I have reviewed the patients History and Physical, chart, labs and discussed the procedure including the risks, benefits and alternatives for the proposed anesthesia with the patient or authorized representative who has indicated his/her understanding and acceptance.     Dental advisory given  Plan Discussed with: CRNA  Anesthesia Plan Comments: (Patient consented for risks of anesthesia including but not limited to:  - adverse reactions to medications - damage to eyes, teeth, lips or other oral mucosa - nerve damage due to positioning  - sore throat or hoarseness - damage to heart, brain, nerves, lungs, other parts of body or loss of life  Informed patient about role of CRNA in peri- and intra-operative care.  Patient voiced understanding.)         Anesthesia Quick Evaluation  "

## 2024-09-04 NOTE — Anesthesia Postprocedure Evaluation (Signed)
"   Anesthesia Post Note  Patient: Raven Lewis  Procedure(s) Performed: APPENDECTOMY, ROBOT-ASSISTED, LAPAROSCOPIC (Abdomen)  Patient location during evaluation: PACU Anesthesia Type: General Level of consciousness: awake and awake and alert Pain management: pain level controlled Vital Signs Assessment: post-procedure vital signs reviewed and stable Respiratory status: spontaneous breathing Cardiovascular status: blood pressure returned to baseline Anesthetic complications: no   No notable events documented.   Last Vitals:  Vitals:   09/04/24 0750 09/04/24 0816  BP: (!) 140/61 (!) 154/72  Pulse: 85 83  Resp: 16 15  Temp: 36.7 C (!) 36.3 C  SpO2: 96% 95%    Last Pain:  Vitals:   09/04/24 0816  TempSrc: Temporal  PainSc: 2                  Raven Lewis      "

## 2024-09-04 NOTE — TOC CM/SW Note (Signed)
 Transition of Care [facility]) - Inpatient Brief Assessment   Patient Details  Name: Raven Lewis MRN: [id number] Date of Birth: 12-21-1934  Transition of Care [facility]) CM/SW Contact:    Corean ONEIDA Haddock, RN Phone Number: 09/04/2024, 1:39 PM   Clinical Narrative:  Transition of Care Department [facility]) has reviewed patient and no TOC needs have been identified at this time.  If new patient transition needs arise, please place a TOC consult.   Transition of Care Asessment: Insurance and Status: Insurance coverage has been reviewed Patient has primary care physician: Yes     Prior/Current Home Services: No current home services Social Drivers of Health Review: SDOH reviewed no interventions necessary Readmission risk has been reviewed: Yes Transition of care needs: no transition of care needs at this time

## 2024-09-04 NOTE — Interval H&P Note (Signed)
 History and Physical Interval Note:  09/04/2024 8:58 AM  Raven Lewis  has presented today for surgery, with the diagnosis of Acute Appendicitis.  The various methods of treatment have been discussed with the patient and family. After consideration of risks, benefits and other options for treatment, the patient has consented to  Procedures: APPENDECTOMY, ROBOT-ASSISTED, LAPAROSCOPIC (N/A) as a surgical intervention.  The patient's history has been reviewed, patient examined, no change in status, stable for surgery.  I have reviewed the patient's chart and labs.  Questions were answered to the patient's satisfaction.     Krystl Wickware [name]

## 2024-09-04 NOTE — Interval H&P Note (Signed)
 No change. OK to proceed.

## 2024-09-04 NOTE — Care Management Obs Status (Signed)
 MEDICARE OBSERVATION STATUS NOTIFICATION   Patient Details  Name: Raven Lewis MRN: [id number] Date of Birth: 17-Nov-1934   Medicare Observation Status Notification Given:  Yes    [name] 09/04/2024, 4:07 PM

## 2024-09-04 NOTE — Discharge Instructions (Addendum)
 Laparoscopic Appendectomy, Care After This sheet gives you information about how to care for yourself after your procedure. Your doctor may also give you more specific instructions. If you have problems or questions, contact your doctor. Follow these instructions at home: Care for cuts from surgery (incisions)  Follow instructions from your doctor about how to take care of your cuts from surgery. Make sure you: Wash your hands with soap and water before you change your bandage (dressing). If you cannot use soap and water, use hand sanitizer. Change your bandage as told by your doctor. Leave stitches (sutures), skin glue, or skin tape (adhesive) strips in place. They may need to stay in place for 2 weeks or longer. If tape strips get loose and curl up, you may trim the loose edges. Do not remove tape strips completely unless your doctor says it is okay. Do not take baths, swim, or use a hot tub until your doctor says it is okay. OK TO SHOWER 24HRS AFTER YOUR SURGERY.  Check your surgical cut area every day for signs of infection. Check for: More redness, swelling, or pain. More fluid or blood. Warmth. Pus or a bad smell. Activity Do not drive or use heavy machinery while taking prescription pain medicine. Do not play contact sports until your doctor says it is okay. Do not drive for 24 hours if you were given a medicine to help you relax (sedative). Rest as needed. Do not return to work or school until your doctor says it is okay. General instructions  tylenol  and advil  as needed for discomfort.  Please alternate between the two every four hours as needed for pain.    Use narcotics, if prescribed, only when tylenol  and motrin  is not enough to control pain.  325-650mg  every 8hrs to max of 3000mg /24hrs (including the 325mg  in every norco dose) for the tylenol .    Advil  up to 400mg  per dose every 8hrs as needed for pain.   To prevent or treat constipation while you are taking prescription pain  medicine, your doctor may recommend that you: Drink enough fluid to keep your pee (urine) clear or pale yellow. Take over-the-counter or prescription medicines. Eat foods that are high in fiber, such as fresh fruits and vegetables, whole grains, and beans. Limit foods that are high in fat and processed sugars, such as fried and sweet foods. Contact a doctor if: You develop a rash. You have more redness, swelling, or pain around your surgical cuts. You have more fluid or blood coming from your surgical cuts. Your surgical cuts feel warm to the touch. You have pus or a bad smell coming from your surgical cuts. You have a fever. One or more of your surgical cuts breaks open. Get help right away if: You have trouble breathing. You have chest pain. You faint or feel dizzy when you stand. You have leg pain. This information is not intended to replace advice given to you by your health care provider. Make sure you discuss any questions you have with your health care provider. Document Released: 06/09/2008 Document Revised: 03/21/2016 Document Reviewed: 02/17/2016 Elsevier Interactive Patient Education  2019 [employer].

## 2024-09-04 NOTE — Transfer of Care (Signed)
 Immediate Anesthesia Transfer of Care Note  Patient: Raven Lewis  Procedure(s) Performed: APPENDECTOMY, ROBOT-ASSISTED, LAPAROSCOPIC (Abdomen)  Patient Location: PACU  Anesthesia Type:General  Level of Consciousness: awake and alert   Airway & Oxygen Therapy: Patient Spontanous Breathing  Post-op Assessment: Report given to RN  Post vital signs: Reviewed and stable  Last Vitals:  Vitals Value Taken Time  BP 164/66 09/04/24 10:49  Temp    Pulse 73 09/04/24 10:54  Resp 14 09/04/24 10:54  SpO2 100 % 09/04/24 10:54  Vitals shown include unfiled device data.  Last Pain:  Vitals:   09/04/24 0816  TempSrc: Temporal  PainSc: 2          Complications: No notable events documented.

## 2024-09-04 NOTE — Anesthesia Procedure Notes (Signed)
 Procedure Name: Intubation Date/Time: 09/04/2024 9:39 AM  Performed by: Satira Johnson, CRNAPre-anesthesia Checklist: Patient identified, Emergency Drugs available, Suction available and Patient being monitored Patient Re-evaluated:Patient Re-evaluated prior to induction Oxygen Delivery Method: Circle system utilized Preoxygenation: Pre-oxygenation with 100% oxygen Induction Type: IV induction Ventilation: Mask ventilation without difficulty Laryngoscope Size: Mac and 3 Grade View: Grade II Tube type: Oral Tube size: 7.0 mm Number of attempts: 1 Airway Equipment and Method: Stylet and Oral airway Placement Confirmation: ETT inserted through vocal cords under direct vision, positive ETCO2 and breath sounds checked- equal and bilateral Secured at: 20 cm Tube secured with: Tape Dental Injury: Teeth and Oropharynx as per pre-operative assessment

## 2024-09-04 NOTE — Op Note (Signed)
 Preoperative diagnosis: acute appendicitis  Postoperative diagnosis: Same  Procedure: Robotic assisted laparoscopic appendectomy.  Anesthesia: GETA  Surgeon: Henriette Pierre  Wound Classification: clean contaminated  Specimen: Appendix  Complications: None  Estimated Blood Loss: 3 mL   Indications: Patient is a 89 y.o. female  presented with above.  Please see H&P for further details.    FIndings: 1.  Irritated appendix  2. No peri-appendiceal abscess or phlegmon 3. Normal anatomy 4. Appendiceal artery ligated and divided with stapler 5. Adequate hemostasis.   Description of procedure: The patient was placed on the operating table in the supine position, left arm tucked. General anesthesia was induced. A time-out was completed verifying correct patient, procedure, site, positioning, and implant(s) and/or special equipment prior to beginning this procedure. The abdomen was prepped and draped in the usual sterile fashion.   Palmer's point located and Veress needle was inserted.  After confirming 2 clicks and a positive saline drop test, gas insufflation was initiated until the abdominal pressure was measured at 15 mmHg.  Afterwards, the Veress needle was removed and a 8 mm port was placed through a periumbilical site using Optiview technique after incision with an 11 blade.  After local was infused, 2 additional incision was made 8 cm apart each side along the left side of the abdominal wall from the initial incision.  An 8 mm port caudad and 12mm port cephalad from initial incision, both under direct visualization.  No injuries from trocar placements were noted. The table was placed in the Trendelenburg position with the right side elevated.  Xi robotic platform was then brought to the operative field and docked.  An inflamed appendix was identified and elevated.  Infection was present within the abdominal cavity due to appendicitis. Window created at base of appendix in the mesentery.    A blue load linear cutting stapler was then used to divide and staple the base of the appendix. It was reloaded with a vascular cartridge and the mesoappendix similarly divided.  No bleeding from the staple lines noted.  The appendix was placed in an endoscopic retrieval bag and removed.   The appendiceal stump and mesoappendix staple line examined again and hemostasis noted. No other pathology was identified within pelvis. The 12 mm trocar removed and port site closed with Efx shield using 0 vicryl under direct vision. Remaining trocars were removed under direct vision. No bleeding was noted.The abdomen was allowed to collapse. All skin incisions then closed with subcuticular sutures Monocryl 4-0.  Wounds then dressed with dermabond.  The patient tolerated the procedure well, awakened from anesthesia and was taken to the postanesthesia care unit in satisfactory condition.  Sponge count and instrument count correct at the end of the procedure.

## 2024-09-04 NOTE — Progress Notes (Signed)
 Mobility Specialist Progress Note:    09/04/24 1651  Mobility  Activity Ambulated with assistance  Level of Assistance Standby assist, set-up cues, supervision of patient - no hands on  Assistive Device None;Other (Comment) (IV pole)  Distance Ambulated (ft) 175 ft  Range of Motion/Exercises Active;All extremities  Activity Response Tolerated well  Mobility visit 1 Mobility  Mobility Specialist Start Time (ACUTE ONLY) 1633  Mobility Specialist Stop Time (ACUTE ONLY) 1651  Mobility Specialist Time Calculation (min) (ACUTE ONLY) 18 min   Pt received in bed, daughter at bedside. Agreeable to mobility, required supervision to stand and ambulate with no AD. Tolerated well, pt utilizes IV pole for stability. Pt c/o mild pain around incision site. Returned to room, left pt fowlers. All needs met.  Sherrilee Ditty Mobility Specialist Please contact via Special Educational Needs Teacher or  Rehab office at [phone]

## 2024-09-05 ENCOUNTER — Other Ambulatory Visit: Payer: Self-pay

## 2024-09-05 ENCOUNTER — Encounter: Payer: Self-pay | Admitting: Surgery

## 2024-09-05 LAB — CBC
HCT: 37.7 % (ref 36.0–46.0)
Hemoglobin: 13.1 g/dL (ref 12.0–15.0)
MCH: 33.7 pg (ref 26.0–34.0)
MCHC: 34.7 g/dL (ref 30.0–36.0)
MCV: 96.9 fL (ref 80.0–100.0)
Platelets: 112 K/uL — ABNORMAL LOW (ref 150–400)
RBC: 3.89 MIL/uL (ref 3.87–5.11)
RDW: 12.1 % (ref 11.5–15.5)
WBC: 7.2 K/uL (ref 4.0–10.5)
nRBC: 0 % (ref 0.0–0.2)

## 2024-09-05 LAB — BASIC METABOLIC PANEL WITH GFR
Anion gap: 10 (ref 5–15)
BUN: 8 mg/dL (ref 8–23)
CO2: 25 mmol/L (ref 22–32)
Calcium: 8.8 mg/dL — ABNORMAL LOW (ref 8.9–10.3)
Chloride: 106 mmol/L (ref 98–111)
Creatinine, Ser: 0.62 mg/dL (ref 0.44–1.00)
GFR, Estimated: >60 mL/min
Glucose, Bld: 152 mg/dL — ABNORMAL HIGH (ref 70–99)
Potassium: 3.7 mmol/L (ref 3.5–5.1)
Sodium: 140 mmol/L (ref 135–145)

## 2024-09-05 LAB — GLUCOSE, CAPILLARY
Glucose-Capillary: 144 mg/dL — ABNORMAL HIGH (ref 70–99)
Glucose-Capillary: 149 mg/dL — ABNORMAL HIGH (ref 70–99)

## 2024-09-05 NOTE — Plan of Care (Signed)
 " Problem: Education: Goal: Ability to describe self-care measures that may prevent or decrease complications (Diabetes Survival Skills Education) will improve 09/05/2024 1021 by Leonce Norene BIRCH, RN Outcome: Adequate for Discharge 09/05/2024 1021 by Leonce Norene BIRCH, RN Outcome: Adequate for Discharge Goal: Individualized Educational Video(s) 09/05/2024 1021 by Leonce Norene BIRCH, RN Outcome: Adequate for Discharge 09/05/2024 1021 by Leonce Norene BIRCH, RN Outcome: Adequate for Discharge   Problem: Coping: Goal: Ability to adjust to condition or change in health will improve 09/05/2024 1021 by Leonce Norene D, RN Outcome: Adequate for Discharge 09/05/2024 1021 by Leonce Norene BIRCH, RN Outcome: Adequate for Discharge   Problem: Fluid Volume: Goal: Ability to maintain a balanced intake and output will improve 09/05/2024 1021 by Leonce Norene D, RN Outcome: Adequate for Discharge 09/05/2024 1021 by Leonce Norene BIRCH, RN Outcome: Adequate for Discharge   Problem: Health Behavior/Discharge Planning: Goal: Ability to identify and utilize available resources and services will improve 09/05/2024 1021 by Leonce Norene BIRCH, RN Outcome: Adequate for Discharge 09/05/2024 1021 by Leonce Norene BIRCH, RN Outcome: Adequate for Discharge Goal: Ability to manage health-related needs will improve 09/05/2024 1021 by Leonce Norene BIRCH, RN Outcome: Adequate for Discharge 09/05/2024 1021 by Leonce Norene BIRCH, RN Outcome: Adequate for Discharge   Problem: Metabolic: Goal: Ability to maintain appropriate glucose levels will improve 09/05/2024 1021 by Leonce Norene BIRCH, RN Outcome: Adequate for Discharge 09/05/2024 1021 by Leonce Norene BIRCH, RN Outcome: Adequate for Discharge   Problem: Nutritional: Goal: Maintenance of adequate nutrition will improve 09/05/2024 1021 by Leonce Norene BIRCH, RN Outcome: Adequate for Discharge 09/05/2024 1021 by Leonce Norene BIRCH, RN Outcome: Adequate for Discharge Goal:  Progress toward achieving an optimal weight will improve 09/05/2024 1021 by Leonce Norene BIRCH, RN Outcome: Adequate for Discharge 09/05/2024 1021 by Leonce Norene BIRCH, RN Outcome: Adequate for Discharge   Problem: Skin Integrity: Goal: Risk for impaired skin integrity will decrease 09/05/2024 1021 by Leonce Norene BIRCH, RN Outcome: Adequate for Discharge 09/05/2024 1021 by Leonce Norene BIRCH, RN Outcome: Adequate for Discharge   Problem: Tissue Perfusion: Goal: Adequacy of tissue perfusion will improve 09/05/2024 1021 by Leonce Norene BIRCH, RN Outcome: Adequate for Discharge 09/05/2024 1021 by Leonce Norene BIRCH, RN Outcome: Adequate for Discharge   Problem: Education: Goal: Knowledge of General Education information will improve Description: Including pain rating scale, medication(s)/side effects and non-pharmacologic comfort measures 09/05/2024 1021 by Leonce Norene BIRCH, RN Outcome: Adequate for Discharge 09/05/2024 1021 by Leonce Norene BIRCH, RN Outcome: Adequate for Discharge   Problem: Health Behavior/Discharge Planning: Goal: Ability to manage health-related needs will improve 09/05/2024 1021 by Leonce Norene BIRCH, RN Outcome: Adequate for Discharge 09/05/2024 1021 by Leonce Norene BIRCH, RN Outcome: Adequate for Discharge   Problem: Clinical Measurements: Goal: Ability to maintain clinical measurements within normal limits will improve 09/05/2024 1021 by Leonce Norene BIRCH, RN Outcome: Adequate for Discharge 09/05/2024 1021 by Leonce Norene BIRCH, RN Outcome: Adequate for Discharge Goal: Will remain free from infection 09/05/2024 1021 by Leonce Norene BIRCH, RN Outcome: Adequate for Discharge 09/05/2024 1021 by Leonce Norene BIRCH, RN Outcome: Adequate for Discharge Goal: Diagnostic test results will improve 09/05/2024 1021 by Leonce Norene BIRCH, RN Outcome: Adequate for Discharge 09/05/2024 1021 by Leonce Norene BIRCH, RN Outcome: Adequate for Discharge Goal: Respiratory complications will  improve 09/05/2024 1021 by Leonce Norene BIRCH, RN Outcome: Adequate for Discharge 09/05/2024 1021 by Leonce Norene BIRCH, RN Outcome: Adequate for Discharge Goal: Cardiovascular complication will be avoided 09/05/2024 1021 by Leonce Norene BIRCH, RN  Outcome: Adequate for Discharge 09/05/2024 1021 by Leonce Norene BIRCH, RN Outcome: Adequate for Discharge   Problem: Activity: Goal: Risk for activity intolerance will decrease 09/05/2024 1021 by Leonce Norene BIRCH, RN Outcome: Adequate for Discharge 09/05/2024 1021 by Leonce Norene BIRCH, RN Outcome: Adequate for Discharge   Problem: Nutrition: Goal: Adequate nutrition will be maintained 09/05/2024 1021 by Leonce Norene BIRCH, RN Outcome: Adequate for Discharge 09/05/2024 1021 by Leonce Norene BIRCH, RN Outcome: Adequate for Discharge   Problem: Coping: Goal: Level of anxiety will decrease 09/05/2024 1021 by Leonce Norene BIRCH, RN Outcome: Adequate for Discharge 09/05/2024 1021 by Leonce Norene BIRCH, RN Outcome: Adequate for Discharge   Problem: Elimination: Goal: Will not experience complications related to bowel motility 09/05/2024 1021 by Leonce Norene BIRCH, RN Outcome: Adequate for Discharge 09/05/2024 1021 by Leonce Norene BIRCH, RN Outcome: Adequate for Discharge Goal: Will not experience complications related to urinary retention 09/05/2024 1021 by Leonce Norene BIRCH, RN Outcome: Adequate for Discharge 09/05/2024 1021 by Leonce Norene BIRCH, RN Outcome: Adequate for Discharge   Problem: Pain Managment: Goal: General experience of comfort will improve and/or be controlled 09/05/2024 1021 by Leonce Norene BIRCH, RN Outcome: Adequate for Discharge 09/05/2024 1021 by Leonce Norene BIRCH, RN Outcome: Adequate for Discharge   Problem: Safety: Goal: Ability to remain free from injury will improve 09/05/2024 1021 by Leonce Norene BIRCH, RN Outcome: Adequate for Discharge 09/05/2024 1021 by Leonce Norene BIRCH, RN Outcome: Adequate for Discharge   Problem: Skin  Integrity: Goal: Risk for impaired skin integrity will decrease 09/05/2024 1021 by Leonce Norene BIRCH, RN Outcome: Adequate for Discharge 09/05/2024 1021 by Leonce Norene BIRCH, RN Outcome: Adequate for Discharge   "

## 2024-09-05 NOTE — Discharge Summary (Signed)
 [facility]-General Surgery  SURGICAL DISCHARGE SUMMARY  Patient ID: Raven Lewis MRN: [id number] DOB/AGE: [birth year] 87 y.o.  Admit date: 09/03/2024 Discharge date: 09/05/2024  Discharge Diagnoses Patient Active Problem List   Diagnosis Date Noted   Acute appendicitis with peritonitis 09/04/2024   Acute appendicitis 09/03/2024   Concern about memory 03/06/2024   Diarrhea 09/27/2023   Elevated BP without diagnosis of hypertension 09/27/2023   Fatigue 05/25/2022   Encounter for antineoplastic chemotherapy and immunotherapy 08/14/2021   Examination of participant in clinical trial 02/24/2021   Malignant melanoma of scalp (HCC) 11/18/2020   Palpitations 08/20/2020   Melanoma (HCC) 07/16/2020   Microalbuminuria due to type 2 diabetes mellitus (HCC) 07/16/2020   T2DM (type 2 diabetes mellitus) (HCC) 03/06/2019   Insomnia 06/20/2018    Consultants None   Procedures Robotic assisted laparoscopic appendectomy    Hospital Course:  Patient presented to the [facility] ED on 09/03/2024 with right lower quadrant abdominal pain x2 days.  In the ED patient was hypertensive with BP of 183/103.  Temp of 97.4 F and HR 78.  Patient was overall stable presenting with focal right lower quadrant tenderness. Labs indicated leukocytosis with WBC of 11.0.  LFTs, alkaline phos, and total bili were within normal range.  Lipase was normal at 32.  CT of abdomen and pelvis showed dilated and fluid-filled appendix, measuring 1 cm with small appendicolith proximally.  No periappendiceal abscess or pneumoperitoneum noted. Patient was taken to the operating room on 09/04/2024 for robotic assisted laparoscopic appendectomy. Surgery went well. Patient tolerated procedure.  Patient is now tolerating regular diet post-op and is ambulating with minimal pain. Surgical incisions show no signs of infection. Pain is well-controlled with pain medication. Patient is clear from surgical standpoint.  Patient will  follow-up outpatient with Dr. [name] in 2 weeks.     Physical Examination:  Constitutional: alert, in no acute distress Pulmonary: CTA bilaterally, normal breath sounds Cardiac: regular rate and rhythm Gastrointestinal: soft, mildly tender around incisions, and non-distended Skin: abdominal incisions look clean, dry, and intact with surgical glue in place   With the sounds on repeat Allergies as of 09/05/2024   No Known Allergies      Medication List     TAKE these medications    acetaminophen  325 MG tablet Commonly known as: Tylenol  Take 2 tablets (650 mg total) by mouth every 8 (eight) hours as needed for mild pain (pain score 1-3).   docusate sodium  100 MG capsule Commonly known as: Colace Take 1 capsule (100 mg total) by mouth 2 (two) times daily as needed for up to 10 days for mild constipation.   freestyle lancets TEST THREE TIMES DAILY   FreeStyle Lite w/Device Kit USE AS DIRECTED   FREESTYLE TEST STRIPS test strip Generic drug: glucose blood TEST THREE TIMES DAILY AS DIRECTEDTEST THREE TIMES DAILY AS DIRECTED   ibuprofen  400 MG tablet Commonly known as: ADVIL  Take 1 tablet (400 mg total) by mouth every 8 (eight) hours as needed for mild pain (pain score 1-3) or moderate pain (pain score 4-6).   metFORMIN  500 MG 24 hr tablet Commonly known as: GLUCOPHAGE -XR TAKE 1 TABLET BY MOUTH TWICE DAILY   multivitamin tablet Take 1 tablet by mouth daily.   oxyCODONE -acetaminophen  5-325 MG tablet Commonly known as: Percocet Take 1 tablet by mouth every 8 (eight) hours as needed for severe pain (pain score 7-10).   Ozempic  (1 MG/DOSE) 4 MG/3ML Sopn Generic drug: Semaglutide  (1 MG/DOSE) INJECT 1MG  SUBCUTANEOUSLY  ONCE A WEEK  zolpidem  5 MG tablet Commonly known as: AMBIEN  Take 1 tablet (5 mg total) by mouth at bedtime as needed for sleep.          Follow-up Information     [name], [name], DO Follow up in 2 week(s).   Specialties: General Surgery,  Surgery Why: post op appy Contact information: [address] [place] [address] [phone]                  Time spent on discharge management including discussion of hospital course, clinical condition, outpatient instructions, prescriptions, and follow up with the patient and members of the medical team: >30 minutes  Alfredo Spong Barrientos PA-C

## 2024-09-06 ENCOUNTER — Telehealth: Payer: Self-pay | Admitting: *Deleted

## 2024-09-06 LAB — SURGICAL PATHOLOGY

## 2024-09-06 NOTE — Transitions of Care (Post Inpatient/ED Visit) (Signed)
 "  09/06/2024  Name: Raven Lewis MRN: [id number] DOB: [birth year]  Today's TOC FU Call Status: Today's TOC FU Call Status:: Successful TOC FU Call Completed TOC FU Call Complete Date: 09/06/24  Patient's Name and Date of Birth confirmed. Name, DOB  Transition Care Management Follow-up Telephone Call Date of Discharge: 09/05/24 Discharge Facility: [facility]) Type of Discharge: Inpatient Admission Primary Inpatient Discharge Diagnosis:: appendicitis/appendectomy How have you been since you were released from the hospital?: Better Any questions or concerns?: No  Items Reviewed: Did you receive and understand the discharge instructions provided?: Yes Medications obtained,verified, and reconciled?: Yes (Medications Reviewed) Any new allergies since your discharge?: No Dietary orders reviewed?: No Do you have support at home?: Yes People in Home [RPT]: alone Name of Support/Comfort Primary Source: [name]  Medications Reviewed Today: Medications Reviewed Today     Reviewed by Kennieth Cathlean DEL, RN (Case Manager) on 09/06/24 at 1050  Med List Status: <None>   Medication Order Taking? Sig Documenting Provider Last Dose Status Informant  acetaminophen  (TYLENOL ) 325 MG tablet [id number] Yes Take 2 tablets (650 mg total) by mouth every 8 (eight) hours as needed for mild pain (pain score 1-3). [name], [name], DO  Active   Blood Glucose Monitoring Suppl (FREESTYLE LITE) w/Device KIT [id number] Yes USE AS DIRECTED Myrla Angela M, MD  Active Child  docusate sodium  (COLACE) 100 MG capsule [id number] Yes Take 1 capsule (100 mg total) by mouth 2 (two) times daily as needed for up to 10 days for mild constipation. [name] Millet, DO  Active   FREESTYLE TEST STRIPS test strip [id number] Yes TEST THREE TIMES DAILY AS DIRECTEDTEST THREE TIMES DAILY AS DIRECTED Myrla Jon HERO, MD  Active Child  ibuprofen  (ADVIL ) 400 MG tablet [id number] Yes Take 1 tablet (400 mg  total) by mouth every 8 (eight) hours as needed for mild pain (pain score 1-3) or moderate pain (pain score 4-6). Sakai, [name], DO  Active   Lancets (FREESTYLE) lancets [id number] Yes TEST THREE TIMES DAILY Bacigalupo, Angela M, MD  Active Child  metFORMIN  (GLUCOPHAGE -XR) 500 MG 24 hr tablet [id number] Yes TAKE 1 TABLET BY MOUTH TWICE DAILY Bacigalupo, Jon HERO, MD  Active Child  Multiple Vitamin (MULTIVITAMIN) tablet [id number] Yes Take 1 tablet by mouth daily. [provider]  Active Child  oxyCODONE -acetaminophen  (PERCOCET) 5-325 MG tablet [id number] Yes Take 1 tablet by mouth every 8 (eight) hours as needed for severe pain (pain score 7-10). [name], [name], DO  Active   Semaglutide , 1 MG/DOSE, (OZEMPIC , 1 MG/DOSE,) 4 MG/3ML SOPN [id number] Yes INJECT 1MG  SUBCUTANEOUSLY  ONCE A WEEK Bacigalupo, Jon HERO, MD  Active Child  zolpidem  (AMBIEN ) 5 MG tablet [id number] Yes Take 1 tablet (5 mg total) by mouth at bedtime as needed for sleep. Myrla Jon HERO, MD  Active Child           Med Note ZENA NATHANAEL LITTIE Austin Sep 03, 2024  6:56 PM) Usually takes every night            Home Care and Equipment/Supplies: Were Home Health Services Ordered?: NA Any new equipment or medical supplies ordered?: NA  Functional Questionnaire: Do you need assistance with bathing/showering or dressing?: No Do you need assistance with meal preparation?: No Do you need assistance with eating?: No Do you have difficulty maintaining continence: No Do you need assistance with getting out of bed/getting out of a chair/moving?: No Do you have difficulty managing or taking your medications?: No  Follow  up appointments reviewed: PCP Follow-up appointment confirmed?: No MD Provider Line Number:[phone] Given: Yes Specialist Hospital Follow-up appointment confirmed?: Yes Date of Specialist follow-up appointment?: 09/20/23 Follow-Up Specialty Provider:: Dr [name] Do you need transportation to your follow-up  appointment?: No Do you understand care options if your condition(s) worsen?: Yes-patient verbalized understanding  SDOH Interventions Today    Flowsheet Row Most Recent Value  SDOH Interventions   Food Insecurity Interventions Intervention Not Indicated  Housing Interventions Intervention Not Indicated  Transportation Interventions Intervention Not Indicated  Utilities Interventions Intervention Not Indicated   RN discussed incision care Discussed and offered 30 day TOC program.  Patient  declined.  The patient has been provided with contact information for the care management team and has been advised to call with any health -related questions or concerns.  The patient verbalized understanding with current plan of care.  The patient is directed to their insurance card regarding availability of benefits coverage   Cathlean Headland BSN RN [facility] Care Management Coordinator [email or web address] Direct Dial: [phone]  Fax: [phone] Website: [email or web address]  "

## 2024-09-06 NOTE — Transitions of Care (Post Inpatient/ED Visit) (Signed)
" ° °  09/06/2024  Name: Raven Lewis MRN: [id number] DOB: May 07, 1935  Today's TOC FU Call Status: Today's TOC FU Call Status:: Unsuccessful Call (1st Attempt) Unsuccessful Call (1st Attempt) Date: 09/06/24  Attempted to reach the patient regarding the most recent Inpatient/ED visit.  Follow Up Plan: Additional outreach attempts will be made to reach the patient to complete the Transitions of Care (Post Inpatient/ED visit) call.   Cathlean Headland BSN RN [facility] Health Care Management Coordinator [email or web address] Direct Dial: [phone]  Fax: [phone] Website: [email or web address]  "

## 2024-09-11 ENCOUNTER — Ambulatory Visit: Payer: Self-pay | Admitting: Family Medicine

## 2024-09-20 ENCOUNTER — Ambulatory Visit: Payer: Self-pay

## 2024-09-20 DIAGNOSIS — Z0000 Encounter for general adult medical examination without abnormal findings: Secondary | ICD-10-CM | POA: Diagnosis not present

## 2024-09-20 NOTE — Patient Instructions (Addendum)
 Raven Lewis,  Thank you for taking the time for your Medicare Wellness Visit. I appreciate your continued commitment to your health goals. Please review the care plan we discussed, and feel free to reach out if I can assist you further.  Please note that Annual Wellness Visits do not include a physical exam. Some assessments may be limited, especially if the visit was conducted virtually. If needed, we may recommend an in-person follow-up with your provider.  Ongoing Care Seeing your primary care provider every 3 to 6 months helps us  monitor your health and provide consistent, personalized care. APPT 03/01/25 @ 9:20 AM FOR FOLLOW- UP  Referrals If a referral was made during today's visit and you haven't received any updates within two weeks, please contact the referred provider directly to check on the status.  Recommended Screenings:  Health Maintenance  Topic Date Due   Osteoporosis screening with Bone Density Scan  Never done   DTaP/Tdap/Td vaccine (1 - Tdap) 06/10/2021   COVID-19 Vaccine (5 - 2025-26 season) 05/15/2024   Eye exam for diabetics  07/14/2024   Flu Shot  12/12/2024*   Hemoglobin A1C  03/01/2025   Complete foot exam   08/31/2025   Medicare Annual Wellness Visit  09/20/2025   Pneumococcal Vaccine for age over 43  Completed   Zoster (Shingles) Vaccine  Completed   Meningitis B Vaccine  Aged Out   Breast Cancer Screening  Discontinued  *Topic was postponed. The date shown is not the original due date.       09/20/2024    9:31 AM  Advanced Directives  Does Patient Have a Medical Advance Directive? Yes  Type of Estate Agent of [place];Living will  Does patient want to make changes to medical advance directive? No - Patient declined  Copy of Healthcare Power of Attorney in Chart? No - copy requested    Vision: Annual vision screenings are recommended for early detection of glaucoma, cataracts, and diabetic retinopathy. These exams can also  reveal signs of chronic conditions such as diabetes and high blood pressure.  Dental: Annual dental screenings help detect early signs of oral cancer, gum disease, and other conditions linked to overall health, including heart disease and diabetes.  Please see the attached documents for additional preventive care recommendations.   NEXT AWV 09/26/25 @ 8:10 AM IN PERSON

## 2024-09-20 NOTE — Progress Notes (Signed)
 "  Chief Complaint  Patient presents with   Medicare Wellness     Subjective:   Raven Lewis is a 89 y.o. female who presents for a Medicare Annual Wellness Visit.  Visit info / Clinical Intake: Medicare Wellness Visit Type:: Subsequent Annual Wellness Visit Persons participating in visit and providing information:: patient Medicare Wellness Visit Mode:: Telephone If telephone:: video error Since this visit was completed virtually, some vitals may be partially provided or unavailable. Missing vitals are due to the limitations of the virtual format.: Unable to obtain vitals - no equipment If Telephone or Video please confirm:: I connected with patient using audio/video enable telemedicine. I verified patient identity with two identifiers, discussed telehealth limitations, and patient agreed to proceed. Patient Location:: home Provider Location:: office Interpreter Needed?: No Pre-visit prep was completed: yes AWV questionnaire completed by patient prior to visit?: no Living arrangements:: (!) lives alone Patient's Overall Health Status Rating: excellent Typical amount of pain: some Does pain affect daily life?: no Are you currently prescribed opioids?: (!) yes  Dietary Habits and Nutritional Risks How many meals a day?: 3 Eats fruit and vegetables daily?: yes Most meals are obtained by: preparing own meals In the last 2 weeks, have you had any of the following?: none Diabetic:: (!) yes Any non-healing wounds?: no How often do you check your BS?: 0 Would you like to be referred to a Nutritionist or for Diabetic Management? : no  Functional Status Activities of Daily Living (to include ambulation/medication): Independent Ambulation: Independent Medication Administration: Independent Home Management (perform basic housework or laundry): Independent Manage your own finances?: yes Primary transportation is: driving Concerns about vision?: no *vision screening is required  for WTM* (readers- Raven Lewis) Concerns about hearing?: no  Fall Screening Falls in the past year?: 0 Number of falls in past year: 0 Was there an injury with Fall?: 0 Fall Risk Category Calculator: 0 Patient Fall Risk Level: Low Fall Risk  Fall Risk Patient at Risk for Falls Due to: No Fall Risks Fall risk Follow up: Falls evaluation completed; Falls prevention discussed  Home and Transportation Safety: All rugs have non-skid backing?: yes All stairs or steps have railings?: N/A, no stairs Grab bars in the bathtub or shower?: (!) no Have non-skid surface in bathtub or shower?: yes Good home lighting?: yes Regular seat belt use?: yes Hospital stays in the last year:: (!) yes How many hospital stays:: 1 Reason: Raven Lewis  Cognitive Assessment Difficulty concentrating, remembering, or making decisions? : yes (Raven Lewis) Will 6CIT or Mini Cog be Completed: yes What year is it?: 0 points What month is it?: 0 points Give patient an address phrase to remember (5 components): [address] [place], [place] About what time is it?: 0 points Count backwards from 20 to 1: 0 points Say the months of the year in reverse: 0 points Repeat the address phrase from earlier: 0 points 6 CIT Score: 0 points  Advance Directives (For Healthcare) Does Patient Have a Medical Advance Directive?: Yes Does patient want to make changes to medical advance directive?: No - Patient declined Type of Advance Directive: Healthcare Power of Raven Lewis; Living will Copy of Healthcare Power of Attorney in Chart?: No - copy requested Copy of Living Will in Chart?: No - copy requested  Reviewed/Updated  Reviewed/Updated: Reviewed All (Medical, Surgical, Family, Medications, Allergies, Care Teams, Patient Goals)    Allergies (verified) Patient has no known allergies.   Current Medications (verified) Outpatient Encounter Medications as of 09/20/2024  Medication Sig  acetaminophen  (TYLENOL ) 325 MG tablet  Take 2 tablets (650 mg total) by mouth every 8 (eight) hours as needed for mild pain (pain score 1-3).   Blood Glucose Monitoring Suppl (FREESTYLE LITE) w/Device KIT USE AS DIRECTED   FREESTYLE TEST STRIPS test strip TEST THREE TIMES DAILY AS DIRECTEDTEST THREE TIMES DAILY AS DIRECTED   ibuprofen  (ADVIL ) 400 MG tablet Take 1 tablet (400 mg total) by mouth every 8 (eight) hours as needed for mild pain (pain score 1-3) or moderate pain (pain score 4-6).   Lancets (FREESTYLE) lancets TEST THREE TIMES DAILY   metFORMIN  (GLUCOPHAGE -XR) 500 MG 24 hr tablet TAKE 1 TABLET BY MOUTH TWICE DAILY   Multiple Vitamin (MULTIVITAMIN) tablet Take 1 tablet by mouth daily.   oxyCODONE -acetaminophen  (PERCOCET) 5-325 MG tablet Take 1 tablet by mouth every 8 (eight) hours as needed for severe pain (pain score 7-10).   Semaglutide , 1 MG/DOSE, (OZEMPIC , 1 MG/DOSE,) 4 MG/3ML SOPN INJECT 1MG  SUBCUTANEOUSLY  ONCE A WEEK   zolpidem  (AMBIEN ) 5 MG tablet Take 1 tablet (5 mg total) by mouth at bedtime as needed for sleep.   No facility-administered encounter medications on file as of 09/20/2024.    History: Past Medical History:  Diagnosis Date   Allergic rhinitis    Cataract    Diabetes mellitus without complication (HCC)    Insomnia    Lung nodule    Melanoma (HCC) 10/16/2020   Breslow's 1.33mm. Level III-IV. Tx at [facility]   Melanoma [facility]) 10/08/2021   Left upper arm. Superficial spreading. Breslow's 0.51mm. Clark's level II   MRSA (methicillin resistant Staphylococcus aureus) 2000   right elbow   Thrombocytopenia    Thrombocytopenia, idiopathic (HCC) 12/20/2018   Past Surgical History:  Procedure Laterality Date   CATARACT EXTRACTION Left    ELBOW SURGERY     MRSA in right elbow   TUBAL LIGATION     WRIST SURGERY     left wrist   XI ROBOTIC LAPAROSCOPIC ASSISTED APPENDECTOMY N/A 09/04/2024   Procedure: APPENDECTOMY, ROBOT-ASSISTED, LAPAROSCOPIC;  Surgeon: Raven Millet, DO;  Location: [facility] ORS;   Service: General;  Laterality: N/A;   Family History  Problem Relation Age of Onset   Breast cancer Mother 21   Heart disease Sister        heart failure   Lung cancer Brother    Heart disease Sister    Colon cancer Neg Hx    Social History   Occupational History   Occupation: retired  Tobacco Use   Smoking status: Former    Current packs/day: 0.00    Average packs/day: 1.5 packs/day for 40.0 years (60.0 ttl pk-yrs)    Types: Cigarettes    Start date: 11/29/1960    Quit date: 11/29/2000    Years since quitting: 23.8   Smokeless tobacco: Never  Vaping Use   Vaping status: Never Used  Substance and Sexual Activity   Alcohol use: Not Currently    Alcohol/week: 1.0 standard drink of alcohol    Types: 1 Glasses of wine per week    Comment: occasionally, less than weekly   Drug use: Never   Sexual activity: Not Currently   Tobacco Counseling Counseling given: Not Answered  SDOH Screenings   Food Insecurity: No Food Insecurity (09/06/2024)  Housing: Low Risk (09/06/2024)  Transportation Needs: No Transportation Needs (09/06/2024)  Utilities: Not At Risk (09/06/2024)  Alcohol Screen: Low Risk (09/14/2023)  Depression (PHQ2-9): Low Risk (09/20/2024)  Financial Resource Strain: Low Risk (09/14/2023)  Physical Activity:  Insufficiently Active (09/20/2024)  Social Connections: Moderately Isolated (09/03/2024)  Stress: No Stress Concern Present (09/20/2024)  Tobacco Use: Medium Risk (09/20/2024)  Health Literacy: Adequate Health Literacy (09/14/2023)   See flowsheets for full screening details  Depression Screen PHQ 2 & 9 Depression Scale- Over the past 2 weeks, how often have you been bothered by any of the following problems? Little interest or pleasure in doing things: 0 Feeling down, depressed, or hopeless (PHQ Adolescent also includes...irritable): 1 PHQ-2 Total Score: 1 Trouble falling or staying asleep, or sleeping too much: 0 Feeling tired or having little energy: 0 Poor  appetite or overeating (PHQ Adolescent also includes...weight loss): 0 Feeling bad about yourself - or that you are a failure or have let yourself or your family down: 0 Trouble concentrating on things, such as reading the newspaper or watching television (PHQ Adolescent also includes...like school work): 0 Moving or speaking so slowly that other people could have noticed. Or the opposite - being so fidgety or restless that you have been moving around a lot more than usual: 0 Thoughts that you would be better off dead, or of hurting yourself in some way: 0 PHQ-9 Total Score: 1 If you checked off any problems, how difficult have these problems made it for you to do your work, take care of things at home, or get along with other people?: Not difficult at all  Depression Treatment Depression Interventions/Treatment : EYV7-0 Score <4 Follow-up Not Indicated     Goals Addressed             This Visit's Progress    DIET - REDUCE SUGAR INTAKE               Objective:    There were no vitals filed for this visit. There is no height or weight on file to calculate BMI.  Hearing/Vision screen Hearing Screening - Comments:: NO AIDS Vision Screening - Comments:: READERS- Raven Lewis Immunizations and Health Maintenance Health Maintenance  Topic Date Due   Bone Density Scan  Never done   DTaP/Tdap/Td (1 - Tdap) 06/10/2021   COVID-19 Vaccine (5 - 2025-26 season) 05/15/2024   OPHTHALMOLOGY EXAM  07/14/2024   Influenza Vaccine  12/12/2024 (Originally 04/14/2024)   HEMOGLOBIN A1C  03/01/2025   FOOT EXAM  08/31/2025   Medicare Annual Wellness (AWV)  09/20/2025   Pneumococcal Vaccine: 50+ Years  Completed   Zoster Vaccines- Shingrix  Completed   Meningococcal B Vaccine  Aged Out   Mammogram  Discontinued        Assessment/Plan:  This is a routine wellness examination for Jazz.  Patient Care Team: Myrla Jon HERO, MD as PCP - General (Family Medicine) Pa, [facility]  [place]  I have personally reviewed and noted the following in the patients chart:   Medical and social history Use of alcohol, tobacco or illicit drugs  Current medications and supplements including opioid prescriptions. Functional ability and status Nutritional status Physical activity Advanced directives List of other physicians Hospitalizations, surgeries, and ER visits in previous 12 months Vitals Screenings to include cognitive, depression, and falls Referrals and appointments  No orders of the defined types were placed in this encounter.  In addition, I have reviewed and discussed with patient certain preventive protocols, quality metrics, and best practice recommendations. A written personalized care plan for preventive services as well as general preventive health recommendations were provided to patient.   Jhonnie GORMAN Das, LPN   04/15/7972   Return in 1 year (on 09/20/2025).  After Visit Summary: (MyChart) Due to this being a telephonic visit, the after visit summary with patients personalized plan was offered to patient via MyChart   Nurse Notes: UTD ON SHOTS EXCEPT FLU; AGED OUT OF MAMMOGRAM, COLONOSCOPY & BDS  "

## 2025-03-01 ENCOUNTER — Ambulatory Visit: Admitting: Family Medicine

## 2025-09-26 ENCOUNTER — Ambulatory Visit
